# Patient Record
Sex: Male | Born: 1950 | Race: Black or African American | Hispanic: No | Marital: Married | Smoking: Never smoker
Health system: Southern US, Community
[De-identification: ages and names within clinical notes are randomized; demographics above are authoritative.]

## PROBLEM LIST (undated history)

## (undated) DIAGNOSIS — K5792 Diverticulitis of intestine, part unspecified, without perforation or abscess without bleeding: Secondary | ICD-10-CM

## (undated) DIAGNOSIS — Z9889 Other specified postprocedural states: Secondary | ICD-10-CM

## (undated) DIAGNOSIS — K56609 Unspecified intestinal obstruction, unspecified as to partial versus complete obstruction: Secondary | ICD-10-CM

## (undated) DIAGNOSIS — I82409 Acute embolism and thrombosis of unspecified deep veins of unspecified lower extremity: Secondary | ICD-10-CM

## (undated) HISTORY — PX: BRAIN SURGERY: SHX531

## (undated) HISTORY — PX: GASTRECTOMY: SHX58

---

## 2003-12-20 ENCOUNTER — Ambulatory Visit (HOSPITAL_COMMUNITY): Admission: RE | Admit: 2003-12-20 | Discharge: 2003-12-20 | Payer: Self-pay | Admitting: *Deleted

## 2006-02-17 ENCOUNTER — Inpatient Hospital Stay (HOSPITAL_COMMUNITY): Admission: EM | Admit: 2006-02-17 | Discharge: 2006-02-21 | Payer: Self-pay | Admitting: Emergency Medicine

## 2006-02-24 ENCOUNTER — Ambulatory Visit: Payer: Self-pay | Admitting: Pulmonary Disease

## 2006-02-24 ENCOUNTER — Inpatient Hospital Stay (HOSPITAL_COMMUNITY): Admission: EM | Admit: 2006-02-24 | Discharge: 2006-03-12 | Payer: Self-pay | Admitting: Emergency Medicine

## 2006-02-25 ENCOUNTER — Encounter (INDEPENDENT_AMBULATORY_CARE_PROVIDER_SITE_OTHER): Payer: Self-pay | Admitting: *Deleted

## 2006-03-16 ENCOUNTER — Inpatient Hospital Stay (HOSPITAL_COMMUNITY): Admission: AD | Admit: 2006-03-16 | Discharge: 2006-03-25 | Payer: Self-pay | Admitting: General Surgery

## 2006-03-16 ENCOUNTER — Ambulatory Visit (HOSPITAL_COMMUNITY): Admission: RE | Admit: 2006-03-16 | Discharge: 2006-03-16 | Payer: Self-pay | Admitting: General Surgery

## 2006-03-16 ENCOUNTER — Ambulatory Visit: Payer: Self-pay | Admitting: Vascular Surgery

## 2006-08-23 ENCOUNTER — Encounter: Admission: RE | Admit: 2006-08-23 | Discharge: 2006-08-23 | Payer: Self-pay | Admitting: General Surgery

## 2006-09-14 ENCOUNTER — Ambulatory Visit (HOSPITAL_COMMUNITY): Admission: RE | Admit: 2006-09-14 | Discharge: 2006-09-16 | Payer: Self-pay | Admitting: General Surgery

## 2006-09-16 ENCOUNTER — Inpatient Hospital Stay (HOSPITAL_COMMUNITY): Admission: RE | Admit: 2006-09-16 | Discharge: 2006-09-23 | Payer: Self-pay | Admitting: General Surgery

## 2006-09-16 ENCOUNTER — Encounter (INDEPENDENT_AMBULATORY_CARE_PROVIDER_SITE_OTHER): Payer: Self-pay | Admitting: General Surgery

## 2006-12-31 ENCOUNTER — Encounter (INDEPENDENT_AMBULATORY_CARE_PROVIDER_SITE_OTHER): Payer: Self-pay | Admitting: Family Medicine

## 2006-12-31 ENCOUNTER — Ambulatory Visit: Payer: Self-pay | Admitting: Vascular Surgery

## 2006-12-31 ENCOUNTER — Ambulatory Visit (HOSPITAL_COMMUNITY): Admission: RE | Admit: 2006-12-31 | Discharge: 2006-12-31 | Payer: Self-pay | Admitting: Family Medicine

## 2010-05-20 NOTE — Op Note (Signed)
Ross [name], Ross [name] NO.:  [id number]   MEDICAL RECORD NO.:  [id number]          PATIENT TYPE:  INP   LOCATION:  1620                         FACILITY:  [facility]   PHYSICIAN:  Anselm Pancoast. Weatherly, M.D.DATE OF BIRTH:  [birth year]   DATE OF PROCEDURE:  09/16/2006  DATE OF DISCHARGE:                               OPERATIVE REPORT   PREOPERATIVE DIAGNOSES:  1. Status post perforated sigmoid diverticulitis with now [facility] and      end colostomy.  2. History of thrombophlebitis right lower extremity.   OPERATION:  Takedown of Hartmann and establish colon continuity with low  pelvic anastomosis.   ANESTHESIA:  General.   SURGEON:  Anselm Pancoast. Zachery Dakins, M.D.   ASSISTANT:  Amber L. Freida Busman, M.D.   HISTORY:  Ross [name] is a 60 year old male who, in February of 2008,  presented to the emergency room here after having a diverticulitis,  pain, cramping and bloating.  He had been seen in the clinic at [facility] and was admitted by the [facility] hospitalist on the 20th.  He had  been in the hospital on February 14 and a CT showed pericolonic  stranding along the mesenteric vessels like diverticulitis, but his  white count improved and he was discharged on oral antibiotics.  For  about 3-4 days, he had worsening of lower abdominal pain and then  returned to the emergency room and was readmitted, and I was asked to  see him at that time.  The patient had marked wall thickening in the  colon.  There was some question of whether this could possibly be a  small bowel problem, as well as what was thought to be diverticulitis,  and with his condition worsening in spite of broad antibiotic coverage,  he was taken to surgery on the 21st and had marked inflammation of the  sigmoid colon with multiple interloop abscesses, and I removed the  inflamed intestines, did an end colostomy and a Hartmann, but he was so  edematous it was impossible to actually close his fascia.  We  packed him  open with wound vacs, and then approximately 3 days later after being on  a respirator in the ICU, took him back.  The inflammation was markedly  improved and I was able to close him at that time.  He started having  bowel function approximately 2-3 days later and was ready for discharge  on March 7, approximately 2 weeks after his surgery, and then started  having leg swelling.  I saw him back in the office approximately 2 days  later.  He was on Lovenox and a prophylactic regimen during this  hospitalization, but the patient weighed over 300 pounds.  Anyway, he  came in with swollen right lower extremity and was readmitted, started  on full heparin, and ultrasound Doppler studies showed that he had  thrombophlebitis of the right lower extremity.  Since then, he has  definitely improved.  He has been managed with the anticoagulation by  Dr. Theresia Lo at [facility], and is now approximately 4-5 months following his  hospitalization, and is admitted now  for colostomy takedown.  The  patient was tentatively scheduled for surgery on Tuesday.  He has been  off his Coumadin now since, I think, last Thursday, and we will start  him on Lovenox on Saturday, but he was confused about when he should  discontinue the Lovenox, and had actually taken a dose of 120 mcg  b.i.d..  His weight is still about 265 pounds.  He had been over 300  pounds.  With him receiving that on the a.m. on Tuesday, I cancelled him  since he was most likely going to get into a bleeding coagulation.  We  did not reprep him, but continued him on a liquid diet and he got his  Lovenox last night, none this morning, and was on the OR schedule for  12:30 today.  Preoperatively, he was taken to the operative suite after  he had received 3 grams of Unasyn and the anesthesiologist was able to  intubate him without difficulty and he has PAS stockings.  I did not  give him any prophylactic dose of Lovenox in preparation for the  surgery  but we hope to start the Lovenox fairly soon after the operation.  We do  have PAS stockings on both lower extremities.   The patient was positioned on the OR table.  Foley catheter inserted  sterilely.  On the abdomen, I first closed the mucosa for end colostomy  and then prepped with Betadine surgical scrub solution and draped in  sterile manner.  The patient's old incision was excised and scar had  hypertrophied where there had been delayed closure.  You could see a  couple of the Novofil sutures and all the sutures were taken down and  the actual skin was removed.  I opened through the midline fascia.  Fortunately, the patient does not have horrible intra-abdominal  adhesions.  He has significant adhesions in the pelvis where he had all  the abscesses and everything, but it is not as bad as if we had tried to  do his surgery earlier, and we could identify the distal sigmoid.  We  had done a barium enema through the proximal and lower.  There appeared  to be two isolated tics in the proximal colon, one kind of close to the  hepatic flexure and the other in the transverse colon, and we are  planning to do an extensive further resection since I had taken a very  generous descending colon and sigmoid colon on his original surgery.  The distal portion really has no significant tics and we opened up the  abdomen very carefully, dissected the omentum from the undersurface of  the abdominal wall and then could get down into the actual pelvis.  He  does have adhesions and these were kind of loosely taken down so that we  did not run the bowel completely from ligament of Treitz and the  terminal ileum, but there are no tight kinks, etc., and probably 95% of  the adhesions were taken down, and the bowel looks unremarkable.  The  distal sigmoid and rectum were freed up.  I elected to remove about 2  inches of the proximal sigmoid so that we were down at an area where  there appears  there is still a lot of fatty tissue around it, but good  wall thickness, and we have a transverse transection of the colon at  this level.  In the proximal area, I had taken down the colostomy  through the  abdominal wall and had gone down basically right at the  peritoneal reflection, not the area that had been up in the colostomy,  but with this, with just kind of mobilizing the proximal peritoneal  lateral reflection and just a little bit of splenic flexure, it will  come together without excessive tension.  I then transected this using  Allen clamp and proximal end of the noncrush clamp and distally removed  probably about 3 inches of distal sigmoid and about 2 inches probably of  the old colostomy proximally, and then a single layer of 3-0 silk knots  inverted anastomosis under direct vision, the anterior sutures, there  were a few Connell sutures anteriorly, and the anastomosis has a good 1-  2 finger size and is lying nicely.  There were a couple of  little areas  of the mesentery going to this most distal sigmoid and it was transected  really right at the bowel wall and these have been tied with 3-0 silk.  I did not identify the left ureter, but we did not open up any  retroperitoneal area either.  On inspection of the area in the left  upper quadrant where we had mobilized close to the splenic factor and in  the pelvis, there was a little bit of oozy toozy from the adhesions  but not actually any active bleeding.  We thoroughly irrigated, washed,  irrigated and reinspected and I elected not to place any drains since  there are no abscesses.  I then closed a little bit of the peritoneum  most inferiorly where there was a little bit of raw surface above the  bladder, and then the fascia was closed with #1 Novofil.  Some were  figure-of-eights.  Some were simple sutures.  In all, a total of about  four packs of sutures were used.  The skin was closed with staples where  it had the  old colostomy.  I closed the inner layer of peritoneum  posterior with 0-Vicryl.  The fascia and the muscle was approximated  with interrupted sutures of #1 Novofil.  I closed the skin after  mobilizing the subcutaneous tissue and used 3-0 Vicryl in the old  colostomy site and then closed with staples in kind of an oblique  direction.  The patient tolerated procedure nicely and has an NG tube in  the stomach.  I am going to recheck lab studies in the morning and then  would like to really start him on Lovenox tomorrow, but I may use kind  of a smaller dose than pure therapeutic because there was certainly more  dissection and I certainly do not want to get any problems with bleeding  postop.  He has got PAS stockings, which we will certainly continue and  do very early ambulation.  We will keep the NG tube for at least 24-48  hours, and hopefully, he will start passing flatus in 2-3 days, and then  start him on a limited liquid diet.           ______________________________  Anselm Pancoast. Zachery Dakins, M.D.     WJW/MEDQ  D:  09/16/2006  T:  09/17/2006  Job:  [id number]   cc:   Vikki Ports, M.D.  Fax: [phone]

## 2010-05-20 NOTE — Discharge Summary (Signed)
NAMETREYON, WYMORE NO.:  [id number]   MEDICAL RECORD NO.:  [id number]          PATIENT TYPE:  INP   LOCATION:  1620                         FACILITY:  [facility]   PHYSICIAN:  Anselm Pancoast. Weatherly, M.D.DATE OF BIRTH:  March 14, 1950   DATE OF ADMISSION:  09/16/2006  DATE OF DISCHARGE:  09/23/2006                               DISCHARGE SUMMARY   DISCHARGE DIAGNOSES:  1. Status post perforated diverticulitis with multiple intra-abdominal      abscesses, Hartmann and end colostomy.  2. History of thrombophlebitis right lower extremity.  3. Exogenous obesity.   POSTOPERATIVE DIAGNOSES:  1. Status post perforated diverticulitis with multiple intra-abdominal      abscesses, Hartmann and end colostomy.  2. History of thrombophlebitis right lower extremity.  3. Exogenous obesity.   OPERATION:  Takedown of end colostomy, Hartmann and reestablish bowel  continuity, low pelvic anastomosis.   HISTORY:  The patient is a 60 year old overweight male who early in the  year presented to [facility] after having episodes of  diverticulitis.  He had been hospitalized, sent home on oral  medications, became worse and was readmitted and had multiple intra-  abdominal abscesses in the abdomen and pelvis.  He underwent an  emergency laparotomy, end colostomy, Hartmann and the bowel so edematous  that we had to do with delayed closure of a wound VAC for approximately  48 hours.  Postoperatively, after he was discharged, developed right  lower extremity thrombophlebitis.  He has been on anticoagulation for  the last six months.  Dr. Theresia Lo is actually managing that and he has  had a barium enema that showed a couple of diverticulosis in the  transverse and one kind of in the splenic flexure area but no evidence  of any significant active disease, etc.  We had admitted him now for  takedown of his colostomy and reestablish bowel continuity.  We had  converted him over from  Coumadin to Lovenox and he made a mistake and  took a dose of Lovenox 120 mcg on the morning we had scheduled surgery.  We postponed him and brought him back in two days later and then  proceeded with his surgery.  Postoperatively, he did nicely.  He had an  NG tube for about 24 hours.  His Foley catheter came out at 48 hours.  It was about three days after surgery before he started passing any  flatus.  He had pretty extensive adhesions in the pelvis and then we  started him on a full liquid diet and he has had a regular diet now for  approximately 24 hours.  He has been on Lovenox 100 mcg twice a day,  starting 24 hours after surgery.  His incisions appear to be healing  nicely.  We have restarted the Coumadin and his Coumadin yesterday had  an INR of 2.1.  Today it has dropped down to 1.7.  He normally takes 14  alternating with 12 mg of Coumadin daily and he has been on 10 for the  past five days.  Since he has dropped back down, we will  resume his  regular dosage and he will continue giving himself the Lovenox, but one  shot a day on Friday, Saturday and a repeat prothrombin time on Monday  morning.  Hopefully, he can discontinue the Lovenox at that time and  will see me in the office for wound check later in the  week.  Half the  staples have been removed.  There is a little bit of drainage from a little area of the skin since  he is anticoagulated, but the wound is healing without evidence of  infection.  The path report showed chronic inflammation, fibrosis and  reactive lymphoid aggregates around the little segment of colostomy and  the distal sigmoid but no evidence of any active diverticulitis.           ______________________________  Anselm Pancoast. Zachery Dakins, M.D.     WJW/MEDQ  D:  09/23/2006  T:  09/23/2006  Job:  [id number]   cc:   Vikki Ports, M.D.  Fax: [phone]   Patient's chart

## 2010-05-21 ENCOUNTER — Emergency Department (HOSPITAL_COMMUNITY): Payer: No Typology Code available for payment source

## 2010-05-21 ENCOUNTER — Emergency Department (HOSPITAL_COMMUNITY)
Admission: EM | Admit: 2010-05-21 | Discharge: 2010-05-21 | Disposition: A | Discharge status: Home / Self Care | Payer: No Typology Code available for payment source | Attending: Emergency Medicine | Admitting: Emergency Medicine

## 2010-05-21 DIAGNOSIS — M2390 Unspecified internal derangement of unspecified knee: Secondary | ICD-10-CM | POA: Insufficient documentation

## 2010-05-21 DIAGNOSIS — Z189 Retained foreign body fragments, unspecified material: Secondary | ICD-10-CM | POA: Insufficient documentation

## 2010-05-21 DIAGNOSIS — Y92410 Unspecified street and highway as the place of occurrence of the external cause: Secondary | ICD-10-CM | POA: Insufficient documentation

## 2010-05-21 DIAGNOSIS — S92909A Unspecified fracture of unspecified foot, initial encounter for closed fracture: Secondary | ICD-10-CM | POA: Insufficient documentation

## 2010-05-21 DIAGNOSIS — R10811 Right upper quadrant abdominal tenderness: Secondary | ICD-10-CM | POA: Insufficient documentation

## 2010-05-21 DIAGNOSIS — S5000XA Contusion of unspecified elbow, initial encounter: Secondary | ICD-10-CM | POA: Insufficient documentation

## 2010-05-21 DIAGNOSIS — S2249XA Multiple fractures of ribs, unspecified side, initial encounter for closed fracture: Secondary | ICD-10-CM | POA: Insufficient documentation

## 2010-05-21 DIAGNOSIS — S0990XA Unspecified injury of head, initial encounter: Secondary | ICD-10-CM | POA: Insufficient documentation

## 2010-05-21 DIAGNOSIS — M795 Residual foreign body in soft tissue: Secondary | ICD-10-CM | POA: Insufficient documentation

## 2010-05-21 DIAGNOSIS — I498 Other specified cardiac arrhythmias: Secondary | ICD-10-CM | POA: Insufficient documentation

## 2010-05-21 DIAGNOSIS — R079 Chest pain, unspecified: Secondary | ICD-10-CM | POA: Insufficient documentation

## 2010-05-21 DIAGNOSIS — K5730 Diverticulosis of large intestine without perforation or abscess without bleeding: Secondary | ICD-10-CM | POA: Insufficient documentation

## 2010-05-21 DIAGNOSIS — IMO0002 Reserved for concepts with insufficient information to code with codable children: Secondary | ICD-10-CM | POA: Insufficient documentation

## 2010-05-21 LAB — CBC
Platelets: 152 10*3/uL (ref 150–400)
RBC: 4.38 MIL/uL (ref 4.22–5.81)
WBC: 6.4 10*3/uL (ref 4.0–10.5)

## 2010-05-21 LAB — COMPREHENSIVE METABOLIC PANEL
AST: 38 U/L — ABNORMAL HIGH (ref 0–37)
Albumin: 3.9 g/dL (ref 3.5–5.2)
Alkaline Phosphatase: 69 U/L (ref 39–117)
Chloride: 104 mEq/L (ref 96–112)
Creatinine, Ser: 0.94 mg/dL (ref 0.4–1.5)
GFR calc Af Amer: >60 mL/min (ref >60)
Potassium: 3.5 mEq/L (ref 3.5–5.1)
Total Bilirubin: 1.2 mg/dL (ref 0.3–1.2)

## 2010-05-21 LAB — DIFFERENTIAL
Basophils Absolute: 0 10*3/uL (ref 0.0–0.1)
Basophils Relative: 0 % (ref 0–1)
Eosinophils Absolute: 0.1 10*3/uL (ref 0.0–0.7)
Neutrophils Relative %: 62 % (ref 43–77)

## 2010-05-21 MED ORDER — IOHEXOL 300 MG/ML  SOLN
100.0000 mL | Freq: Once | INTRAMUSCULAR | Status: AC | PRN
  Administered 2010-05-21: 100 mL via INTRAVENOUS

## 2010-05-23 NOTE — H&P (Signed)
NAMEJOVE, BEYL NO.:  [id number]   MEDICAL RECORD NO.:  [id number]          PATIENT TYPE:  INP   LOCATION:  1430                         FACILITY:  [facility]   PHYSICIAN:  Anselm Pancoast. Weatherly, M.D.DATE OF BIRTH:  May 28, 1950   DATE OF ADMISSION:  03/16/2006  DATE OF DISCHARGE:                              HISTORY & PHYSICAL   READMISSION NOTE:   CHIEF COMPLAINT:  Leg pain and swelling, right.   HISTORY:  Ross [name] is a 60 year old overweight black male who was  admitted here a little over two weeks ago with persistent  diverticulitis, developing an abscess in the pelvis.  Had an emergency  surgery.  I think it was on Thursday.  Was found to have markedly intra-  abdominal abscesses, perforated diverticulitis, so edematous that we did  a wound vac and then was taken back to surgery approximately four days  later.  The wound closed.  There was no evidence of any frank abscesses.  The cultures were negative at that time.  He also had gallstones that  were removed during the first operation.  The patient improved, was on  subcutaneous heparin 5000 units subcu q.8h. initially, and then after he  was having NG bleeding from the NG tube, we switched him to q.12h.,  which was continued right up to the time that he was discharged last  Friday.  He has a delayed closure of his abdominal wound, and I saw him  back in the office today for a wound check.  He appears to be doing fine  from an abdominal standpoint and denied any definite shortness of  breath, but did have swelling in the right lower extremity to 2+, that  was definitely asymmetrical with the other side.  I sent him over to  [facility] for a duplex Doppler study to rule out thrombophlebitis.  The  pO2 in the office was 95.  The patient did not appear to be  significantly short of breath.  He is a large individual and always  seems to have a little bit more energy with his breathing.  When he was  admitted up to the floor, his pO2 on room air was 98%.  The patient on  questioning whether he has had any chest discomfort, etc., said, No,  not really, that once when he was going up steps at home, he maybe  became a little lightheaded but no definite chest pain.  He is not  complaining of chest pain at this time.   PAST MEDICAL HISTORY:  The patient states that besides being overweight,  he has never had any actual medical problems with problems with sugar,  blood pressure, cardiac issues.  He was discharged only on Vicodin.  Please refer to the past medical history or the review of systems for  details.  The patient is a Optician, dispensing.  Is now readmitted to 1430.   PHYSICAL EXAMINATION:  VITAL SIGNS:  His pulse was 100.  His  respirations were 24.  Blood pressure is 130/87.  His temperature is  98.8.  in the office, he weighed 240.6  pounds.  He says his weight is  normally about 245 pounds.  He is not very tall.  HEENT:  Unremarkable.  He appears adequately hydrated.  LUNGS:  Good breath sounds bilaterally in the office.  CARDIAC:  Slight sinus tachycardia of 100.  ABDOMEN:  His incision appears to be healing satisfactorily.  He has  some delayed sutures from the closure of his lower abdominal wound.  His  colostomy is pink, functioning nicely.  EXTREMITIES:  He definitely has a swollen right lower leg, compared with  the left, with 2+ edema on the right, and essentially no edema on the  left.  Duplex Doppler study is consistent with clots on both lower  extremities, right more so than the left.   The patient is now readmitted for intravenous heparin.  I am going to  keep him at bedrest until the swelling subsides.  We will go ahead and  give him a bolus of heparin and continuous heparin infusion at this  time.  Will allow him to be at bedside to void.  Of course, he has a  colostomy, so it is not necessary to go to the bathroom in order to have  bowel function.  The patient will  continue with the Vicodin for pain.  He says his pain is in the right lower leg, it just feels tight.  He is  not complaining of any abdominal pain.  He has required no pain  medication for his abdomen since Sunday.  He was discharged last Friday.           ______________________________  Anselm Pancoast. Zachery Dakins, M.D.     WJW/MEDQ  D:  03/16/2006  T:  03/18/2006  Job:  [id number]

## 2010-05-23 NOTE — Op Note (Signed)
NAMEMASE, DHONDT NO.:  [id number]   MEDICAL RECORD NO.:  [id number]          PATIENT TYPE:  INP   LOCATION:  0159                         FACILITY:  [facility]   PHYSICIAN:  Anselm Pancoast. Weatherly, M.D.DATE OF BIRTH:  [birth year]   DATE OF PROCEDURE:  03/01/2006  DATE OF DISCHARGE:                               OPERATIVE REPORT   PREOPERATIVE DIAGNOSIS:  Status post sigmoid colectomy with drainage of  pelvic and multiple intra-abdominal abscesses, end colostomy and  Hartmann and cholecystectomy.  The patient had a wound VAC.   POSTOPERATIVE DIAGNOSIS:  Significant improvement of no evidence of any  intra-abdominal abscesses.  The operation was kind of a mini laparotomy,  irrigation, cultures, and closure of abdominal wound.   ANESTHESIA:  General.   SURGEON:  Anselm Pancoast. Zachery Dakins, M.D.   HISTORY:  Ross [name] is a 60 year old overweight male who was  operated on in the early a.m. on last Thursday with about a five day  history of progressive abdominal pain, fever.  Had been hospitalized for  about three days.  Discharged but bounced back in approximately 24  hours.  CT showed definite collections, and it looked like possibly  whether it was a diverticulitis or ischemic segment of small bowel, we  could not be sure radiologically, but at the time of surgery, he was  found to have significant sigmoid diverticulitis, multiple pelvic and  interloop abscesses.  Also, gallstones.  A sigmoid colectomy, Hartmann  and end colostomy was performed.  Open cholecystectomy.  In a sense, he  was so edematous, so tight and distended, we closed him with a wound VAC  and have kept him paralyzed now for approximately four days.  He did  when he came back to the ICU approximately six hours postoperatively, he  became light and eviscerated, but I paralyzed him and reclosed him with  the wound VAC at bedside.  He has done nicely since then.  He is no  longer acidotic.  His white  count is coming down.  He has broad  antibiotic coverage.  The cultures have shown multiple organisms, and  the original path report showed a perforated colon as an etiology of  these multiple intra-abdominal and pelvic abscesses.  We have discussed  with the family that hopefully we will be able to just kind of explore  him, make sure there is no evidence of any new abscesses and go ahead  and close his wound.  If his bowel is so edematous as it was before,  they understand that we may need to apply another wound VAC and wait a  few days before closure of the abdomen.   The patient was taken to over to the operating suite, intubated,  paralyzed, positioned on the OR table, and the anesthetic agent switched  over to the anesthesiologist.   I first changed his colostomy bag, as it was over part of the wound VAC,  with Steri-Strips, draped, and put a new wound in through his quarter-  inch Hollister bag over the stomach.  The mucosa is nice and pink with a  small amount of stool in it.  I then removed the outer wound VAC,  plastic, etc., and I flushed a few stitches in it at the time of  surgery. When I closed it at the bedside where he eviscerated, and these  were removed.  We then removed the sponge and the one that is in the  plastic, and I was very impressed with how much greatly improved he is  now.  In the pelvis where he has had such a large abscess, hemorrhagic,  everything initially now, you can see the drain.  There is no evidence  of any obvious pelvic abscesses.  The mucosa where I transected the  upper rectum, I did place a couple of 2-0 Prolene sutures in it to  identify this when we do a colostomy take-down in 4-5 months, and the  area where the staple closure is definitely intact.  Then the interloop  abscesses, 3-4, with such an edematous segment of terminal ileum and  all, that really looks nearly normal now, greatly improved, and the  bowel is found to have abnormal  caliber, and he is not as distended as  he was when we operated on him.  Next, I looked up under the subhepatic  area where the open cholecystectomy is, and there is no bile, there is  no evidence of infection, etc.  I ran my hand up around the spleen.  There was a little bit of kind of clear fluid but certainly no evidence  of any frank pus in any of these areas.  I then very gently kind of  separated the omentum from the loops of small bowel in the mid section  to make sure there were no other interloop abscesses and found nothing,  abscess or questionable small bowel.  I then put the omentum back over  the small bowel by advancing it down a little bit, so we could get  complete closure over the bowel with a midline incision and then closed  this incision with single interrupted 1-0 Novofil.  I did put a couple  of retention sutures of #5 Ethibond with ridges and placed a few 2-0  silk sutures that did not tie in the skin so that if he continues having  good granulation tissue and healing, I can approximate the adipose area  in the lower abdomen without actually suturing the area to minimize the  pain, as we will do a delayed closure in probably 5-6 days.  I am very  pleased with our findings.  The sponge count was correct.  There was an  incorrect instrument __________ .  An Freida Busman was mixed in with a Market researcher.  We got an x-ray.  There should not be any since very few instruments  were used since the intra-abdominal was mainly __________  laparotomy  only and the resection was sutured instead of with the exception of  putting the little sutures in the rectal stump for later identification.  The patient will be taken back to the ICU intubated but will now start  him on kind of a taper down of the anesthetic paralytic agents, so  hopefully he can be extubated in the morning, provided he continues to  do well.  His chest x-ray today was certainly improved, and he has been diuresed to get the  significant edema that was so prevalent immediately  after surgery.  He is on TPN, and we are going to continue with the  broad antibiotic coverage and wait  for results of the overall anaerobic  and aerobic cultures that we did tonight.           ______________________________  Anselm Pancoast. Zachery Dakins, M.D.     WJW/MEDQ  D:  03/01/2006  T:  03/02/2006  Job:  [id number]

## 2010-05-23 NOTE — Discharge Summary (Signed)
Ross [name], Ross [name] NO.:  [id number]   MEDICAL RECORD NO.:  [id number]          PATIENT TYPE:  INP   LOCATION:  1620                         FACILITY:  [facility]   PHYSICIAN:  Kela Millin, M.D.DATE OF BIRTH:  09/10/1950   DATE OF ADMISSION:  02/17/2006  DATE OF DISCHARGE:  02/21/2006                               DISCHARGE SUMMARY   DISCHARGE DIAGNOSES:  1. Diverticulitis.  2. Hypokalemia.  3. History of diverticulosis.   HISTORY OF PRESENT ILLNESS:  The patient is a 60 year old, black male  with a past medical history significant for diverticulosis - per a  colonoscopy a few years ago who presented with complaints of abdominal  pain.  He reported that he had begun having left lower quadrant  pain 5  to 6 days prior to admission.  He admitted to nausea but no vomiting.  He also stated that he had been constipated for some time and went to an  urgent care clinic where he was given an enema and had some liquid  stools thereafter.  He denied chest pain, shortness of breath, dysuria,  melena, also denied hematochezia.  He was seen in the ER and the CT scan  of the abdomen showed findings consistent with a diverticulitis and he  was admitted for further evaluation and management.   PHYSICAL EXAMINATION:  Upon admission revealed a temperature of 98.4 and  a blood pressure 117/71.  Pulse of 88.  Respiratory rate of 24.  O2 sat  was 93%.  The pertinent findings on exam were:  HEENT:  Dry mucous membranes.  ABDOMEN:  Obese, bowel sounds present. Left lower quadrant tenderness  with no rebound tenderness, nondistended.  No organomegaly.  No masses  palpable.  The rest of his physical exam was within normal limits.   LABORATORY DATA:  The CT scan of his abdomen and pelvis revealed left  lower quadrant pericolonic stranding extending along the mesenteric  vessels to SMV/PV, mostly diverticulitis without abscess or perforation.  His white cell count was 10.2,  hemoglobin 13.9, hematocrit of 39.5 with  a platelet count of 95 and neutrophil of 83%.  His sodium was 133,  potassium 3.3, chloride 100, CO2 26, glucose 135, BUN 19, creatinine  1.1, calcium 8.5.  He was lipase was 14, amylase 27.  Urinalysis was  trace leukocyte esterase, nitrite negative, and 3-6 WBCs.   HOSPITAL COURSE:  1. Diverticulitis - upon admission, the patient had a CT scan of his      abdomen and pelvis and the results as stated above.  He was kept      NPO, hydrated with IV fluids, and placed on IV analgesic pain      control.  He had plain films of his abdomen done on the next      hospital day and these were negative for obstruction or free air.      As his symptoms improved, he was started on a clear-liquid diet and      this was gradually advanced to a low-residual diet.  The patient's      nausea resolved,  and he has been tolerating a low-residual diet      well.  He had a bowel movement with no hematochezia and no melena.      The patient was started on antibiotics upon admission and he will      be discharged on oral antibiotics and is to follow up with his      primary care physician.  The patient will also need follow up with      a gastroenterologist for further evaluation with colonoscopy once      he is completely recovered from this episode of diverticulitis and      I have discussed this with him and he will follow up with his      primary care physician for the referral.  2. Hypokalemia - The patient's potassium was replaced during his      hospital stay.   DISCHARGE MEDICATIONS:  1. Cipro 500 mg 1 p.o. b.i.d.  2. Flagyl 500 mg p.o. q. 8 hours.  3. Percocet 1 to 2 p.o. q. 4 to 6 hours p.r.n.   DISCHARGE CONDITION:  Improved.   FOLLOWUP:  Dr. Theresia Lo in 1-2 weeks.      Kela Millin, M.D.  Electronically Signed     ACV/MEDQ  D:  02/21/2006  T:  02/21/2006  Job:  [id number]   cc:   Vikki Ports, M.D.  Fax: [phone]

## 2010-05-23 NOTE — H&P (Signed)
NAMEHIROSHI, KRUMMEL NO.:  [id number]   MEDICAL RECORD NO.:  [id number]          PATIENT TYPE:  INP   LOCATION:  1620                         FACILITY:  [facility]   PHYSICIAN:  Kela Millin, M.D.DATE OF BIRTH:  25-Apr-1950   DATE OF ADMISSION:  02/17/2006  DATE OF DISCHARGE:                              HISTORY & PHYSICAL   PRIMARY CARE PHYSICIAN:  Dr. Theresia Lo.   CHIEF COMPLAINT:  Abdominal pain.   HISTORY OF PRESENT ILLNESS:  The patient is a 60 year old black male  with no significant past medical history who presents with a complaint  of abdominal pain times 5 to 6 days.  He describes the pain as severe, 9  out of 10 in intensity, and left lower quadrant in location.  No  precipitating or alleviating factors reported.  He admits to chills,  nausea, but no vomiting.  He denies diarrhea, states that his last bowel  movement was Friday night.  After he ate some chicken, he had a loose  bowel movement at that time, but stated that he has not had any bowel  movements since then - about 5 days ago.  He states that he went to an  urgent care clinic on Monday and was given an enema and initially states  that he did not have any bowel movement after the enema, but later on  states that he had some liquid stools after the enema.  He also states  that prior to developing the abdominal pain, he had regular daily bowel  movements.  He denies chest pain, shortness of breath, dysuria, melena,  or hematochezia.  He admits to a nonproductive cough times a few days.   The patient was seen in the ER and a CT scan of his abdomen revealed  findings consistent with diverticulitis and he is admitted to the [facility] service for further evaluation and management.   PAST MEDICAL HISTORY:  None.   MEDICATIONS:  None.   ALLERGIES:  NKDA.   SOCIAL HISTORY:  He states that he quit tobacco in 1978 after smoking  for about 5 years.  He denies alcohol.   FAMILY HISTORY:   He has 2 sisters with diabetes mellitus and 1 brother  deceased at the age of 57 following an MI.   REVIEW OF SYSTEMS:  As per HPI, other review of systems is negative.   PHYSICAL EXAMINATION:  GENERAL:  The patient is an obese middle-aged  black male.  He is alert and oriented, and appears to be in mild to  moderate distress secondary to his abdominal pain.  VITAL SIGNS:  Temperature 98.4, blood pressure 115/71 but initially  129/85, pulse of 88, and respiratory rate of 24.  O2 sat of 93%.  HEENT:  PERRL, EOMI.  Dry mucous membranes.  No oral exudates.  NECK:  Supple.  No adenopathy.  No thyromegaly.  No JVD.  LUNGS:  Clear to auscultation bilaterally.  No crackles or wheezes.  CARDIOVASCULAR:  Regular rate and rhythm.  Normal S1, S2.  No murmurs or  gallops appreciated.  ABDOMEN:  Obese, bowel sounds  present.  Left lower quadrant tenderness.  No rebound tenderness.  Nondistended.  No organomegaly appreciated.  No  masses palpable.  EXTREMITIES:  No cyanosis.  No edema.  NEURO:  Alert, oriented x3.  Cranial nerves, II through XII, are grossly  intact.  Nonfocal exam.   LABORATORY DATA:  CT of abdomen and pelvis - left lower quadrant  pericolonic stranding extending along mesenteric vessels to SMV/PV  confluence, most likely diverticulitis without abscess or perforation.  His white cell count is 15.2, hemoglobin 13.9, hematocrit 39.5, platelet  count 95, neutrophil count 83%.  His sodium is 134 with a potassium of  3.3, chloride 100, CO2 of 26, glucose 131, BUN 19, creatinine 1.1,  calcium 8.5.  Albumin 2.9.  His AST is 43.  Lipase 14, amylase 27.  Urinalysis with specific gravity 1.026, clear in appearance, trace  leukocyte esterase, negative nitrite, 3-6 WBCs.   ASSESSMENT AND PLAN:  1. Diverticulitis - as discussed above, we will place the patient on      antibiotics, intravenous fluids, and pain management/supportive      care.  Keep n.p.o.  As noted per the CT scan, no  evidence of      abscess or perforation.  We will follow and consider surgical      consultation when appropriate.  The patient will need outpatient      follow up with gastroenterology when he recovers for      screening/further evaluation as appropriate.  2. Hypokalemia - replete potassium.  3. Probable urinary tract infection - obtain urine cultures, empiric      antibiotics as above.      Kela Millin, M.D.  Electronically Signed     ACV/MEDQ  D:  02/18/2006  T:  02/18/2006  Job:  [id number]   cc:   Vikki Ports, M.D.

## 2010-05-23 NOTE — Discharge Summary (Signed)
NAMEDENNARD, VEZINA NO.:  [id number]   MEDICAL RECORD NO.:  [id number]          PATIENT TYPE:  INP   LOCATION:  1302                         FACILITY:  [facility]   PHYSICIAN:  Ross Shad. Rudean Curt, MD     DATE OF BIRTH:  1950/02/10   DATE OF ADMISSION:  02/23/2006  DATE OF DISCHARGE:  03/12/2006                               DISCHARGE SUMMARY   DISCHARGE DIAGNOSES:  1. Perforated sigmoid diverticulitis with multiple pelvic and intra-      abdominal abscesses.  2. Ventilator-dependent respiratory failure.   CONSULTATIONS:  Dr. Consuello [name] of [facility].   PROCEDURE:  1. Exploratory laparotomy with lysis of adhesions, drainage of      abscesses, sigmoid colectomy with end-colostomy, Hartmann      procedure, and cholecystectomy performed by Dr. Zachery [name] on      February 21.  2. Mini laparotomy with irrigation and closure of abdominal wound,      performed by Dr. Zachery [name] on February 25th.   SUMMARY OF HOSPITALIZATION:  Ross [name] is a 60 year old male with no  past medical history, who was admitted with abdominal pain on February  20th.  Details of his presentation can be found in his history and  physical dictated by Dr. Rito [name] on February 20th.  The patient was  found to have intra-abdominal abscesses and was taken to the operating  room on February 21, where an exploratory laparotomy was performed,  revealing perforated sigmoid diverticulitis with multiple intra-  abdominal abscesses.  These abscesses were drained, adhesions were  lysed, and a sigmoid colectomy with end-colostomy was performed.  The  patient was sent from the operating room directly to the intensive care  unit on full ventilator support.  His early ICU course was complicated  by the opening of his abdominal wound with evisceration at the bedside.  He was managed very closely by Dr. Shelle [name] and the critical care team in  concert with Dr. Zachery [name] from surgery.  He was kept on  the ventilator  and paralyzed while receiving broad-spectrum intravenous antibiotics.   On February 25th, the patient was taken back to the operating room,  where it was found that his abdominal cavity had greatly improved, and  there was no evidence of further intra-abdominal abscesses.  His  abdominal wound was closed at that time.  He was extubated on February  27th and returned to my service on March 1.  Surgery remained  actively involved with patient and managed most of his active issues.  He remained on total peripheral nutrition until he was able to advance  his diet during the last few days before discharge.  The patient will be  managed closely by Dr. Zachery [name] as an outpatient.  We will give final  recommendations prior to discharge.      Ross Shad. Rudean Curt, MD  Electronically Signed     PML/MEDQ  D:  03/11/2006  T:  03/11/2006  Job:  [id number]   cc:   Ross [name], M.D.  [address], [address]  [place]  [place] [address]   Ross Fus  Rodell [name], M.D.  Fax: [phone]

## 2010-05-23 NOTE — H&P (Signed)
NAMEGILMAN, OLAZABAL NO.:  [id number]   MEDICAL RECORD NO.:  [id number]          PATIENT TYPE:  EMS   LOCATION:  ED                           FACILITY:  [facility]   PHYSICIAN:  Hollice Espy, M.D.DATE OF BIRTH:  [birth year]   DATE OF ADMISSION:  02/24/2006  DATE OF DISCHARGE:                              HISTORY & PHYSICAL   PRIMARY CARE PHYSICIAN:  Dr. Lanell Persons of [employer].   CONSULTANT:  Dr. Zachery Dakins, Surgery.   CHIEF COMPLAINT:  Abdominal pain.   HISTORY OF PRESENT ILLNESS:  The patient is a 60 year old African  American male with no past medical history, who was recently on the  [facility] Service for abdominal pain.  Pain started on February 12, 2006, mostly in the left lower quadrant.  He was admitted on February 18, 2006 as per his CT scan showing pericolonic stranding along the  mesenteric vessels, likely diverticulitis.  His white count at the time  was 15.2.  The patient was kept on the hospitalist service for 3-4 days,  but changed over to p.o. antibiotics and discharged home.  Since then,  his abdominal pain has worsened and he has returned back to the hospital  with a white count again around 15 and slight dehydration.  A CT scan  was reordered and he was found to have no acute findings in the abdomen,  but the pelvis showed worsening inflammatory process in the left lower  quadrant, not intimately associated with the sigmoid colon.  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  The patient had an elevated creatinine at  the time of admission and therefore his study was done without contrast.  Wall thickening within the left lower abdominal/upper pelvis small bowel  loop compatible with enteritis was noted, new since the previous study.  After discussion with Surgery, the patient was evaluated  by Dr.  Zachery Dakins, who felt that the patient would need to be readmitted and  probably need a resection and possible anastomoses.  In the meantime,  they recommended broad-spectrum antibiotics and bowel rest.  Currently,  the patient is feeling well.  He has received pain medication and he is  feeling better.  He denies any headaches or visual changes.  No chest  pain or palpitations.  No shortness of breath.  No wheezing or cough.  He does complain of some generalized abdominal pain; however, it is  significantly much better than previous.  He denies any hematuria or  dysuria.  No diarrhea.  He has had a little bit of constipation.  He  denies any focal extremity numbness, weakness or pain and overall he  feels quite fatigued.  His review of systems is otherwise negative.   PAST MEDICAL HISTORY:  Essentially unremarkable, other than his recent  hospitalization for his abdominal pain a few days earlier.   MEDICATIONS:  Normally are none.  He has been put on recent Cipro,  Flagyl and Percocet, on which he was sent  home.   ALLERGIES:  None.   SOCIAL HISTORY:  He denies any tobacco, alcohol or drug use.  He used to  be a smoker, but quite many years ago.   FAMILY HISTORY:  Noncontributory.   PHYSICAL EXAMINATION:  VITALS ON ADMISSION:  Temperature 98.1, heart  rate 81, blood pressure initially 90/51 and following IV fluids, 121/82,  respirations 20.  O2 SAT 95% on room air.  GENERAL:  The patient is alert and oriented x3, in some mild distress  secondary to abdominal pain.  HEENT:  Normocephalic, atraumatic.  His mucous membranes are dry.  NECK:  He has no carotid bruits.  HEART:  Regular rate and rhythm.  S1 and S2.  LUNGS:  He has some mild bilateral expiratory wheezing.  ABDOMEN:  Soft, but distended.  Some generalized tenderness mostly over  the umbilicus.  Decreased breath sounds.  EXTREMITIES:  No clubbing or cyanosis.  Trace pitting edema.   LABORATORY WORK:  White count  15.2, H&H 14.2 and 40.8, MCV of 91,  platelet count 423,000 and 90% neutrophils.  Sodium 128, potassium 4.7,  chloride 101, bicarb 20, BUN 18, creatinine 1.9, glucose 135.  Albumin  is noted at 2.8; the rest of his LFTs are unremarkable.  Lipase is  normal at 83.  UA shows a small amount of leukocyte esterase, 100 of  protein, 1 of urobilinogen.  Cardiac enzymes are unremarkable and he  only has 0-2 white and red cells with calcium oxalate crystals present.   ASSESSMENT AND PLAN:  1. 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  As      per Surgery, we will put the patient on bowel rest, pain control,      intravenous fluids and broad-spectrum antibiotics.  We will follow      him with serial exams and he may need to go to surgery.  In the      meantime, I will put him on clear liquids.  2. Acute renal failure.  Likely, this is from dehydration.  We will      hydrate and follow.  3. Protein malnutrition secondary to abdominal pain.  We will keep the      patient nothing-by-mouth for now.  If he needs to be nothing-by-      mouth for an extended period of time, we will look into total      parenteral nutrition.  4. Hypotension, resolved with intravenous fluids, secondary to      dehydration.      Hollice Espy, M.D.  Electronically Signed     SKK/MEDQ  D:  02/24/2006  T:  02/24/2006  Job:  [id number]   cc:   Vikki Ports, M.D.  Fax: [phone]   Anselm Pancoast. Zachery Dakins, M.D.  [address], [address]  [place]  [place] [address]

## 2010-05-23 NOTE — Discharge Summary (Signed)
Ross [name], Ross [name] NO.:  [id number]   MEDICAL RECORD NO.:  [id number]          PATIENT TYPE:  INP   LOCATION:  1430                         [facility]ACILITY:  [facility]   PHYSICIAN:  Anselm Pancoast. Weatherly, M.D.DATE OF BIRTH:  04-02-1950   DATE OF ADMISSION:  03/16/2006  DATE OF DISCHARGE:  03/25/2006                               DISCHARGE SUMMARY   DISCHARGE DIAGNOSES:  1. Thrombophlebitis lower extremities, right worse than left.  2. Status post recent surgery for perforated sigmoid diverticulitis      and multiple intra-abdominal abscesses, surgery done.   HISTORY:  Ross [name] is a 60 year old overweight black male who was  admitted on March 16, 2006 with the following history.  Approximately  two weeks earlier, he had been admitted with severe lower abdominal  tenderness, found to have perforated diverticulitis with multiple intra-  abdominal abscesses and was taken to surgery.  Had frank multiple areas  of infection.  Also cholecystectomy and postoperatively originally had a  wound VAC, and was taken back to surgery, his abdomen was closed and he  was on subcutaneous heparin 500 units q.8h. and then started having some  NG bleeding through the nasogastric tube.  We switched him to the  heparin q.12h.  This was continued up until the time of his discharge  last Friday.  He returned to the office today for a wound check, about 3  days after his release.  He denied any shortness of breath, but did note  that he had swelling in his right lower extremity and he had definite  edema in the right lower extremity that was not present when he was sent  home about 3 days earlier.  He had a duplex Doppler study performed at  [facility] that showed significant embolus in the vessels of the right lower  extremity and also some superficial thrombophlebitis on the left.  The  first was the right.  I admitted him after the Doppler studies and  started him on intravenous heparin.  The  patient has done satisfactory  from his abdominal standpoint.  Colostomy is working well and he 24  hours after starting the continuous heparin was started on oral  Coumadin, but was slow to get the prothrombin time to start being  elevated.  It appears that his Coumadin is probably going to take either  10 or 7.5 mg day.  His Coumadin was 10 mg on the three preceding days  prior to his discharge and his prothrombin time is 2 on the March 25, 2006.  I have advised that we discharge him on 7.5 mg of Coumadin and  repeat a prothrombin time the next Saturday which is the Saturday after  good Friday, but I will talk with him at that time advise him on what  doses of Coumadin take.  He will follow up with Dr. Theresia Lo the  following Tuesday who will be regulating his Coumadin.  He has Vicodin  which he can take for his pain.  He is wearing above the knee TED hose.  There is no prolonged sitting and the edema  in his lower extremities  which was fairly marked for the first three or four days has definitely  decreased and after about 3 days of essentially bedrest here in the  hospital with the exception of getting up to void he was started  ambulation with the above the knee TED hose.  At no time has he had any  shortness of breath consistent with a pulmonary embolus and a chest x-  ray during this admission showed no acute disease.  He is discharged in  improved condition will follow up with me for his  wound and we plan on probably taking his colostomy down in approximately  four to six months.  The patient states that his brother who is also  approximately a similar size also had problems with diverticulitis,  multiple abscesses and also thrombophlebitis after his surgery which was  done in [place] several years ago.           ______________________________  Anselm Pancoast. Zachery Dakins, M.D.     WJW/MEDQ  D:  04/14/2006  T:  04/15/2006  Job:  [id number]   cc:   Vikki Ports, M.D.  Fax:  [phone]

## 2010-05-23 NOTE — Op Note (Signed)
NAMEELEANOR, [name] NO.:  [id number]   MEDICAL RECORD NO.:  [id number]          PATIENT TYPE:  INP   LOCATION:  0159                         FACILITY:  [name]   PHYSICIAN:  Anselm Pancoast. Weatherly, M.D.DATE OF BIRTH:  1950-01-22   DATE OF PROCEDURE:  02/25/2006  DATE OF DISCHARGE:                               OPERATIVE REPORT   PREOPERATIVE DIAGNOSIS:  Abscess of left lower quadrant with  questionable perforation of small bowel, questionable diverticulitis.   POSTOPERATIVE DIAGNOSIS:  Perforated sigmoid diverticulitis with  multiple pelvic and intra-abdominal loop abscesses, chronic  cholecystitis with stones.   OPERATION:  Exploratory laparotomy, lysis of extensive adhesions and  drainage of multiple abscesses, sigmoid colectomy with end colostomy,  Hartmann procedure and cholecystectomy.   ANESTHESIA:  General anesthesia.   SURGEON:  Alonzo Owczarzak. Zachery Dakins, M.D.   ASSISTANT:  Sharlet Salina T. Hoxworth, M.D.   HISTORY:  [name] is a 60 year old male who was admitted to the  hospitalist service on February 14 after the CT showed pericolonic  stranding on mesenteric vessels, likely diverticulitis.  White count was  15,000 and he was in the [name] for about 3 days changed over to p.o.  antibiotics and discharged.  His abdominal pain worsened.  He  returned  back to the emergency room approximately 24 hours later and a repeat CT  was ordered and found worsening inflammatory process in the lower  abdomen and whether this was a sigmoid diverticulitis or possibly small  bowel inflammation ischemic area we could not tell.  We reviewed the x-  rays with the radiologist and did a Gastrografin within the rectum,  repeat pelvic CT and this we could not actually see a perforation of the  colon.  We know the patient has got diverticulosis and it appears that  the process is worsening with more fluid, area of small bowel that  looked like questionably whether it was  viable.  The patient does not  want a colostomy unless it is absolutely necessary but with his  condition worsening, he was beginning to have fever.  He had been  started on Zosyn and Flagyl earlier this morning and permission was  obtained to proceed on with an emergency laparotomy.  I talked with the  hospitalist and they were in agreement with this and he was taken to  surgery approximately 1:30 a.m.   After induction of general anesthesia, a Foley catheter was placed and  he had a moderate amount of kind of very thick concentrated urine and  with the induction of general anesthesia, you can feel a big fullness in  the left lower quadrant that you could appreciate the fullness and  certainly appreciate the tenderness before the general anesthesia.  We  carefully opened through the midline and through the subcutaneous tissue  and thinned out muscle layer and then opened into the peritoneal cavity.  The small bowel was just matted together with multiple acute, chronic  inflammation and the loops of bowel, we extended the incision since he  was just so tight you could not actually run your hand in the abdomen  and we started breaking down these adhesions between the loops of small  bowel and then there was a big frank abscess along the root of the ileum  close to the mesentery and also the base of the sigmoid colon.  The  sigmoid colon was very edematous, got numerous appendices epiploica,  certainly feels like a sigmoid diverticulitis in spite of the contrast  not showing an actual perforation.  His appendix is kind of involved  within the inflammation but does not appear to be appendicitis.  I went  ahead and removed the appendix.  The appendiceal mesentery was divided  between [name] and tied with 2-0 silk and then the base of the appendix  was doubly tied with 2-0 Vicryl and then the appendix removed.  This to  me did not look like a ruptured appendicitis but just a significant   periappendiceal inflammation.  We then continued breaking up the intra-  abdominal loop abscesses and could not see any actual perforation of the  small-bowel.  It appears that the areas of the small bowel is related to  it being adjacent to the infection and not the cause of the infection.  With this we elected to go ahead and do a sigmoid colectomy.  We used a  contour stapler, it did not completely close the area and then we took a  second fire with a TA-60 with the thick staples to kind of close the  distal portion better since there was definitely a gap where we had  actually kind of gone through the colon when we were trying to get  contour around.  We then used the LigaSure as far as there was frank  abscesses within the mesentery of the sigmoid colon addition to this  very large wound that was adjacent to the mesentery and we transected  everything very close to the vessels to the colon wall since there was  no way we could identify the ureter in this just massive inflammation  and edema in this very large black male.  We took the distal proximally  and over to the descending colon proximal sigmoid and used a TA 60 to  close the stay side and then transected the large area.  The colon is  certainly thickened and we did not actually identify the abscess or the  perforation but I am sure this is a perforated sigmoid colon.  We then  thoroughly irrigated and aspirated with checking other quadrants of the  abdomen and found a large pus collection up around the liver.  He has  also get numerous stones, packed in his gallbladder but not that of a  true acute cholecystitis.  We thought possibly as an option that we  could possibly do a loop colostomy up in the right upper quadrant and  with doing that I think since he has got certainly the stones and I fear  that he will get acute cholecystitis, it would be best just go ahead and remove the gallbladder at this time.  We extended the  incision up about  3 inches and then Dr. Johna Sheriff went to the right side so we could  retract and work from the left taking the gallbladder down in a  retrograde manner. We did identify the cystic artery that was doubly  clipped and then divided distally and then freed the gallbladder right  down to its cystic duct and then tied the cystic duct with 2-0 silk and  also put a clip  on it.  There was good hemostasis.  We irrigated,  aspirated all this infection that we had noticed previously was gone.  Really he is tense enough and the mesentery is not going to work easily  to do a loop so we elected to go ahead and just mobilize the descending  colon better so we could bring it up as an end colostomy.  This and two  finger opening was made through the left rectum sort of at the umbilicus  level and then we could bring up the descending colon and the largest  appendices epiploica I have ever seen was then transected with the  LigaSure to allow [place] to bring it through the abdominal wall.  We then  again thoroughly irrigated.  The bowel was very edematous and I think  that there is no way that we can close the fascia and elected to put a  wound vac on him.  The plastic is actually within the peritoneal cavity  and used about the salad plate size vacuums and then held these in place  with the stickies applied appropriately about three levels, three layers  and then put the vacuum on the area so as to make a little X cruciate  incision in the midportion of the area.  I then went ahead and matured  the colostomy with interrupted sutures of 3-0 Vicryl and then placed a  colostomy bag over it.  I had placed a Blake drain in the pelvis.  This  is mainly to make sure we do not have any urine.  The actual abscesses  and all had actually been washed out, loculated released and etc..  Will  definitely leave a Blake drain in the area.  Next the small bowel of  course had been placed back in as close to  anatomic position with the  omentum brought down over it  prior to putting on the wound vac.  Sponge  count was correct.  The estimated blood loss was probably 300 mL.  There  was a little area in one of the vessels supplying the sigmoid, the  descending colon that required suturing for a little better hemostasis.  The patient was taken to the ICU intubated and will get critical care to  assist [place] in respirator and management.  If he is doing satisfactory,  hopefully we can just wait and take him back for wound vac change on  Monday and hopefully go ahead and close the fascia at that time.  I will  get a PICC line in him and go ahead and get him started on TPN in spite  of him being very heavy since it is most likely going to be a least a  week before he will actually be eating.           ______________________________  Anselm Pancoast. Zachery Dakins, M.D.     WJW/MEDQ  D:  02/25/2006  T:  02/25/2006  Job:  [id number]

## 2010-06-24 ENCOUNTER — Inpatient Hospital Stay (HOSPITAL_COMMUNITY)
Admission: EM | Admit: 2010-06-24 | Discharge: 2010-06-28 | DRG: 027 | Disposition: A | Discharge status: Home Health | Payer: No Typology Code available for payment source | Attending: Neurological Surgery | Admitting: Neurological Surgery

## 2010-06-24 ENCOUNTER — Emergency Department (HOSPITAL_COMMUNITY): Payer: No Typology Code available for payment source

## 2010-06-24 DIAGNOSIS — I2510 Atherosclerotic heart disease of native coronary artery without angina pectoris: Secondary | ICD-10-CM | POA: Diagnosis present

## 2010-06-24 DIAGNOSIS — K5730 Diverticulosis of large intestine without perforation or abscess without bleeding: Secondary | ICD-10-CM | POA: Diagnosis present

## 2010-06-24 DIAGNOSIS — Z86718 Personal history of other venous thrombosis and embolism: Secondary | ICD-10-CM

## 2010-06-24 DIAGNOSIS — I6200 Nontraumatic subdural hemorrhage, unspecified: Principal | ICD-10-CM | POA: Diagnosis present

## 2010-06-24 LAB — URINALYSIS, ROUTINE W REFLEX MICROSCOPIC
Glucose, UA: NEGATIVE mg/dL
Hgb urine dipstick: NEGATIVE
Ketones, ur: NEGATIVE mg/dL
Leukocytes, UA: NEGATIVE
Protein, ur: NEGATIVE mg/dL

## 2010-06-24 LAB — BASIC METABOLIC PANEL
Chloride: 107 mEq/L (ref 96–112)
GFR calc Af Amer: >60 mL/min (ref >60)
Potassium: 3.8 mEq/L (ref 3.5–5.1)
Sodium: 144 mEq/L (ref 135–145)

## 2010-06-24 LAB — TYPE AND SCREEN: Antibody Screen: NEGATIVE

## 2010-06-24 LAB — DIFFERENTIAL
Basophils Absolute: 0 10*3/uL (ref 0.0–0.1)
Eosinophils Relative: 1 % (ref 0–5)
Lymphocytes Relative: 26 % (ref 12–46)
Monocytes Absolute: 0.8 10*3/uL (ref 0.1–1.0)

## 2010-06-24 LAB — CBC
HCT: 41.5 % (ref 39.0–52.0)
MCHC: 35.9 g/dL (ref 30.0–36.0)
RDW: 12.3 % (ref 11.5–15.5)

## 2010-06-24 LAB — ABO/RH: ABO/RH(D): O POS

## 2010-06-25 ENCOUNTER — Inpatient Hospital Stay (HOSPITAL_COMMUNITY): Payer: No Typology Code available for payment source

## 2010-06-27 ENCOUNTER — Inpatient Hospital Stay (HOSPITAL_COMMUNITY): Payer: No Typology Code available for payment source
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
## 2010-07-07 ENCOUNTER — Other Ambulatory Visit: Payer: Self-pay | Admitting: Neurological Surgery

## 2010-07-07 DIAGNOSIS — S065X9A Traumatic subdural hemorrhage with loss of consciousness of unspecified duration, initial encounter: Secondary | ICD-10-CM

## 2010-07-21 ENCOUNTER — Observation Stay (HOSPITAL_COMMUNITY): Payer: No Typology Code available for payment source

## 2010-07-21 ENCOUNTER — Ambulatory Visit (HOSPITAL_COMMUNITY)
Admission: RE | Admit: 2010-07-21 | Discharge: 2010-07-21 | Disposition: A | Discharge status: Home / Self Care | Payer: No Typology Code available for payment source | Source: Ambulatory Visit | Attending: Sports Medicine | Admitting: Sports Medicine

## 2010-07-21 ENCOUNTER — Inpatient Hospital Stay (HOSPITAL_COMMUNITY)
Admission: AD | Admit: 2010-07-21 | Discharge: 2010-07-29 | DRG: 299 | Disposition: A | Discharge status: Home / Self Care | Payer: No Typology Code available for payment source | Source: Ambulatory Visit | Attending: Internal Medicine | Admitting: Internal Medicine

## 2010-07-21 DIAGNOSIS — E669 Obesity, unspecified: Secondary | ICD-10-CM | POA: Diagnosis present

## 2010-07-21 DIAGNOSIS — I2510 Atherosclerotic heart disease of native coronary artery without angina pectoris: Secondary | ICD-10-CM | POA: Diagnosis present

## 2010-07-21 DIAGNOSIS — I82409 Acute embolism and thrombosis of unspecified deep veins of unspecified lower extremity: Principal | ICD-10-CM | POA: Diagnosis present

## 2010-07-21 DIAGNOSIS — M79609 Pain in unspecified limb: Secondary | ICD-10-CM

## 2010-07-21 DIAGNOSIS — Z4789 Encounter for other orthopedic aftercare: Secondary | ICD-10-CM

## 2010-07-21 DIAGNOSIS — Z7982 Long term (current) use of aspirin: Secondary | ICD-10-CM

## 2010-07-21 DIAGNOSIS — S065X0A Traumatic subdural hemorrhage without loss of consciousness, initial encounter: Secondary | ICD-10-CM | POA: Diagnosis present

## 2010-07-21 DIAGNOSIS — Z86718 Personal history of other venous thrombosis and embolism: Secondary | ICD-10-CM

## 2010-07-21 DIAGNOSIS — Y998 Other external cause status: Secondary | ICD-10-CM

## 2010-07-21 DIAGNOSIS — I10 Essential (primary) hypertension: Secondary | ICD-10-CM | POA: Diagnosis present

## 2010-07-22 ENCOUNTER — Other Ambulatory Visit: Payer: No Typology Code available for payment source

## 2010-07-22 LAB — PROTIME-INR: Prothrombin Time: 15.1 seconds (ref 11.6–15.2)

## 2010-07-22 LAB — CBC
MCV: 88.2 fL (ref 78.0–100.0)
Platelets: 141 10*3/uL — ABNORMAL LOW (ref 150–400)
RBC: 3.91 MIL/uL — ABNORMAL LOW (ref 4.22–5.81)
WBC: 6.5 10*3/uL (ref 4.0–10.5)

## 2010-07-22 LAB — BASIC METABOLIC PANEL
CO2: 26 mEq/L (ref 19–32)
Calcium: 8.9 mg/dL (ref 8.4–10.5)
GFR calc Af Amer: >60 mL/min (ref >60)
GFR calc non Af Amer: >60 mL/min (ref >60)
Sodium: 142 mEq/L (ref 135–145)

## 2010-07-22 LAB — HEPARIN LEVEL (UNFRACTIONATED)
Heparin Unfractionated: <0.1 IU/mL — ABNORMAL LOW (ref 0.30–0.70)
Heparin Unfractionated: <0.1 IU/mL — ABNORMAL LOW (ref 0.30–0.70)

## 2010-07-23 LAB — BASIC METABOLIC PANEL
BUN: 8 mg/dL (ref 6–23)
Creatinine, Ser: 0.77 mg/dL (ref 0.50–1.35)
GFR calc Af Amer: >60 mL/min (ref >60)
GFR calc non Af Amer: >60 mL/min (ref >60)

## 2010-07-23 LAB — CBC
MCHC: 35.4 g/dL (ref 30.0–36.0)
RDW: 12.5 % (ref 11.5–15.5)

## 2010-07-23 LAB — HEPARIN LEVEL (UNFRACTIONATED)
Heparin Unfractionated: 1.02 IU/mL — ABNORMAL HIGH (ref 0.30–0.70)
Heparin Unfractionated: 0.91 IU/mL — ABNORMAL HIGH (ref 0.30–0.70)

## 2010-07-23 LAB — PROTIME-INR: Prothrombin Time: 15.9 seconds — ABNORMAL HIGH (ref 11.6–15.2)

## 2010-07-24 LAB — COMPREHENSIVE METABOLIC PANEL
ALT: 26 U/L (ref 0–53)
CO2: 22 mEq/L (ref 19–32)
Calcium: 9.7 mg/dL (ref 8.4–10.5)
Chloride: 104 mEq/L (ref 96–112)
GFR calc Af Amer: >60 mL/min (ref >60)
GFR calc non Af Amer: >60 mL/min (ref >60)
Glucose, Bld: 86 mg/dL (ref 70–99)
Sodium: 138 mEq/L (ref 135–145)
Total Bilirubin: 0.8 mg/dL (ref 0.3–1.2)

## 2010-07-24 LAB — CBC
MCH: 30.5 pg (ref 26.0–34.0)
MCV: 87.4 fL (ref 78.0–100.0)
Platelets: 159 10*3/uL (ref 150–400)
RBC: 4.43 MIL/uL (ref 4.22–5.81)
RDW: 12.7 % (ref 11.5–15.5)

## 2010-07-25 LAB — CBC
HCT: 38.6 % — ABNORMAL LOW (ref 39.0–52.0)
MCH: 31.2 pg (ref 26.0–34.0)
MCHC: 35.8 g/dL (ref 30.0–36.0)
MCV: 87.1 fL (ref 78.0–100.0)
RDW: 12.6 % (ref 11.5–15.5)

## 2010-07-25 LAB — PROTIME-INR: INR: 1.34 (ref 0.00–1.49)

## 2010-07-26 LAB — CBC
MCH: 31 pg (ref 26.0–34.0)
MCHC: 35.3 g/dL (ref 30.0–36.0)
MCV: 87.8 fL (ref 78.0–100.0)
Platelets: 190 10*3/uL (ref 150–400)
RBC: 4.26 MIL/uL (ref 4.22–5.81)
RDW: 12.8 % (ref 11.5–15.5)

## 2010-07-26 LAB — HEPARIN LEVEL (UNFRACTIONATED): Heparin Unfractionated: 0.41 IU/mL (ref 0.30–0.70)

## 2010-07-27 ENCOUNTER — Inpatient Hospital Stay (HOSPITAL_COMMUNITY): Payer: No Typology Code available for payment source

## 2010-07-27 LAB — CBC
HCT: 38.6 % — ABNORMAL LOW (ref 39.0–52.0)
Hemoglobin: 13.6 g/dL (ref 13.0–17.0)
RDW: 12.7 % (ref 11.5–15.5)
WBC: 5.5 10*3/uL (ref 4.0–10.5)

## 2010-07-27 LAB — PROTIME-INR: INR: 1.6 — ABNORMAL HIGH (ref 0.00–1.49)

## 2010-07-27 LAB — HEPARIN LEVEL (UNFRACTIONATED): Heparin Unfractionated: 1.05 IU/mL — ABNORMAL HIGH (ref 0.30–0.70)

## 2010-07-28 LAB — CBC
HCT: 38 % — ABNORMAL LOW (ref 39.0–52.0)
MCH: 31.6 pg (ref 26.0–34.0)
MCV: 88.2 fL (ref 78.0–100.0)
Platelets: 215 10*3/uL (ref 150–400)
RBC: 4.31 MIL/uL (ref 4.22–5.81)

## 2010-07-28 LAB — PROTIME-INR: Prothrombin Time: 20.1 seconds — ABNORMAL HIGH (ref 11.6–15.2)
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
## 2010-10-16 LAB — CBC
HCT: 31 — ABNORMAL LOW
HCT: 31.4 — ABNORMAL LOW
HCT: 31.4 — ABNORMAL LOW
HCT: 32.6 — ABNORMAL LOW
Hemoglobin: 10.8 — ABNORMAL LOW
Hemoglobin: 11.2 — ABNORMAL LOW
Hemoglobin: 11.2 — ABNORMAL LOW
MCHC: 34.4
MCHC: 34.8
MCHC: 35.3
MCHC: 35.7
MCV: 89
MCV: 89.7
MCV: 90.8
MCV: 91
Platelets: 165
Platelets: 182
RBC: 3.58 — ABNORMAL LOW
RDW: 13.3
RDW: 13.5

## 2010-10-16 LAB — PROTIME-INR
Prothrombin Time: 17.3 — ABNORMAL HIGH
Prothrombin Time: 20.8 — ABNORMAL HIGH

## 2010-10-17 LAB — PROTIME-INR: INR: 1.1

## 2010-10-17 LAB — BASIC METABOLIC PANEL
BUN: 4 — ABNORMAL LOW
BUN: 8
CO2: 26
CO2: 26
Calcium: 8.3 — ABNORMAL LOW
Calcium: 9.2
Chloride: 109
GFR calc Af Amer: >60
GFR calc non Af Amer: >60
GFR calc non Af Amer: >60
Glucose, Bld: 110 — ABNORMAL HIGH
Glucose, Bld: 138 — ABNORMAL HIGH
Glucose, Bld: 89
Potassium: 3.8
Potassium: 4.2
Sodium: 137
Sodium: 140

## 2010-10-17 LAB — CBC
HCT: 32.5 — ABNORMAL LOW
HCT: 33.1 — ABNORMAL LOW
HCT: 34.4 — ABNORMAL LOW
Hemoglobin: 11.5 — ABNORMAL LOW
Hemoglobin: 11.5 — ABNORMAL LOW
Hemoglobin: 11.9 — ABNORMAL LOW
MCHC: 34.9
MCV: 90.5
Platelets: 145 — ABNORMAL LOW
RBC: 3.61 — ABNORMAL LOW
RBC: 3.81 — ABNORMAL LOW
RDW: 13.7
RDW: 13.8
RDW: 13.9
WBC: 6.2

## 2010-12-03 ENCOUNTER — Telehealth: Payer: Self-pay | Admitting: Oncology

## 2010-12-03 NOTE — Telephone Encounter (Signed)
S/w wife today re appt for 12/4 @ 10:30 am w/FS.

## 2010-12-09 ENCOUNTER — Other Ambulatory Visit: Payer: Self-pay | Admitting: Oncology

## 2010-12-09 ENCOUNTER — Other Ambulatory Visit (HOSPITAL_BASED_OUTPATIENT_CLINIC_OR_DEPARTMENT_OTHER): Payer: No Typology Code available for payment source | Admitting: Lab

## 2010-12-09 ENCOUNTER — Telehealth: Payer: Self-pay | Admitting: Oncology

## 2010-12-09 ENCOUNTER — Ambulatory Visit (HOSPITAL_BASED_OUTPATIENT_CLINIC_OR_DEPARTMENT_OTHER): Payer: No Typology Code available for payment source | Admitting: Oncology

## 2010-12-09 ENCOUNTER — Encounter: Payer: Self-pay | Admitting: *Deleted

## 2010-12-09 ENCOUNTER — Ambulatory Visit: Payer: No Typology Code available for payment source

## 2010-12-09 VITALS — BP 121/78 | HR 46 | Temp 97.3°F | Ht 70.0 in | Wt 258.4 lb

## 2010-12-09 DIAGNOSIS — I8290 Acute embolism and thrombosis of unspecified vein: Secondary | ICD-10-CM

## 2010-12-09 DIAGNOSIS — I749 Embolism and thrombosis of unspecified artery: Secondary | ICD-10-CM

## 2010-12-09 LAB — CBC WITH DIFFERENTIAL/PLATELET
BASO%: 1 % (ref 0.0–2.0)
LYMPH%: 39.9 % (ref 14.0–49.0)
MCHC: 34.3 g/dL (ref 32.0–36.0)
MCV: 94.6 fL (ref 79.3–98.0)
MONO%: 10.1 % (ref 0.0–14.0)
NEUT#: 2.1 10*3/uL (ref 1.5–6.5)
Platelets: 172 10*3/uL (ref 140–400)
RBC: 4.47 10*6/uL (ref 4.20–5.82)
RDW: 13.2 % (ref 11.0–14.6)
WBC: 4.6 10*3/uL (ref 4.0–10.3)
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
## 2010-12-09 NOTE — Progress Notes (Signed)
Note Dictated

## 2010-12-09 NOTE — Telephone Encounter (Signed)
Made a note °

## 2010-12-12 LAB — HYPERCOAGULABLE PANEL, COMPREHENSIVE
Anticardiolipin IgG: 15 GPL U/mL (ref <23)
Beta-2 Glyco I IgG: 0 G Units (ref <20)
DRVVT 1:1 Mix: 37.5 secs (ref 34.1–42.2)
DRVVT: 52 secs — ABNORMAL HIGH (ref 34.1–42.2)
Lupus Anticoagulant: NOT DETECTED
Protein C Activity: 16 % — ABNORMAL LOW (ref 75–133)
Protein S Activity: 42 % — ABNORMAL LOW (ref 69–129)
Protein S Total: 58 % — ABNORMAL LOW (ref 60–150)

## 2010-12-12 LAB — COMPREHENSIVE METABOLIC PANEL
ALT: 24 U/L (ref 0–53)
AST: 23 U/L (ref 0–37)
Albumin: 4.4 g/dL (ref 3.5–5.2)
Alkaline Phosphatase: 56 U/L (ref 39–117)
Potassium: 3.9 mEq/L (ref 3.5–5.3)
Sodium: 139 mEq/L (ref 135–145)
Total Bilirubin: 0.9 mg/dL (ref 0.3–1.2)
Total Protein: 7 g/dL (ref 6.0–8.3)

## 2011-01-22 ENCOUNTER — Ambulatory Visit (HOSPITAL_BASED_OUTPATIENT_CLINIC_OR_DEPARTMENT_OTHER): Payer: No Typology Code available for payment source | Admitting: Oncology

## 2011-01-22 VITALS — BP 132/80 | HR 66 | Temp 97.0°F | Ht 70.0 in | Wt 264.1 lb

## 2011-01-22 DIAGNOSIS — I8290 Acute embolism and thrombosis of unspecified vein: Secondary | ICD-10-CM

## 2011-01-22 DIAGNOSIS — Z86718 Personal history of other venous thrombosis and embolism: Secondary | ICD-10-CM

## 2011-01-22 NOTE — Progress Notes (Signed)
Hematology and Oncology Follow Up Visit  Ross [name] [id number] 07-21-1950 61 y.o. 01/22/2011 4:00 PM    Principle Diagnosis: Ross [name] is a 61 year old gentleman history of DVTs. He has had 2 episodes, the first one dating back to 2008 after he had presented with acute diverticulitis, and at that time he was hospitalized at that time. The second DVT was on 07/2010 after a left ankle surgery.    Current therapy: Full dose anticoagulation with coumadin.   Interim History:  Ross [name] presents today for a follow up visit. He was seen  here on 12/09/2010 for and an evaluation for these two DVTs. He has been doing well on coumadin since 07/2010. Since the last visit, he reports no thrombotic or bleeding events. He is asymptomatic at this time. His hypercoagulable work up was discussed today and it was essentially negative. His Protein S and C  Are slightly low due to being on coumadin, but other wise all labs are normal.  Medications: I have reviewed the patient's current medications. Current outpatient prescriptions:fish oil-omega-3 fatty acids 1000 MG capsule, Take 2 g by mouth daily.  , Disp: , Rfl: ;  hydrochlorothiazide (HYDRODIURIL) 25 MG tablet, Take 25 mg by mouth daily.  , Disp: , Rfl: ;  metoprolol succinate (TOPROL-XL) 25 MG 24 hr tablet, Take 25 mg by mouth daily.  , Disp: , Rfl: ;  Multiple Vitamin (MULTIVITAMIN) capsule, Take 1 capsule by mouth daily.  , Disp: , Rfl:  warfarin (COUMADIN) 10 MG tablet, Take 10 mg by mouth daily. Except on Tuesday and thursdays patient takes 12 mg , Disp: , Rfl:   Allergies: Allergies no known allergies  Past Medical History, Surgical history, Social history, and Family History were reviewed and updated.  Review of Systems: Constitutional:  Negative for fever, chills, night sweats, anorexia, weight loss, pain. Cardiovascular: no chest pain or dyspnea on exertion Respiratory: no cough, shortness of breath, or wheezing Neurological: no TIA or stroke  symptoms Dermatological: negative ENT: negative Skin: Negative. Gastrointestinal: no abdominal pain, change in bowel habits, or black or bloody stools Genito-Urinary: no dysuria, trouble voiding, or hematuria Hematological and Lymphatic: negative Breast: negative Musculoskeletal: negative Remaining ROS negative. Physical Exam: Blood pressure 132/80, pulse 66, temperature 97 F (36.1 C), temperature source Oral, height 5\' 10"  (1.778 m), weight 264 lb 1.6 oz (119.795 kg). ECOG: 0 General appearance: alert Head: Normocephalic, without obvious abnormality, atraumatic Neck: no adenopathy, no carotid bruit, no JVD, supple, symmetrical, trachea midline and thyroid not enlarged, symmetric, no tenderness/mass/nodules Lymph nodes: Cervical, supraclavicular, and axillary nodes normal. Heart:regular rate and rhythm, S1, S2 normal, no murmur, click, rub or gallop Lung:chest clear, no wheezing, rales, normal symmetric air entry Abdomin: soft, non-tender, without masses or organomegaly EXT:no erythema, induration, or nodules   Lab Results: Lab Results  Component Value Date   WBC 4.6 12/09/2010   HGB 14.5 12/09/2010   HCT 42.3 12/09/2010   MCV 94.6 12/09/2010   PLT 172 12/09/2010     Chemistry      Component Value Date/Time   NA 139 12/09/2010 1106   K 3.9 12/09/2010 1106   CL 106 12/09/2010 1106   CO2 27 12/09/2010 1106   BUN 15 12/09/2010 1106   CREATININE 1.04 12/09/2010 1106      Component Value Date/Time   CALCIUM 9.7 12/09/2010 1106   ALKPHOS 56 12/09/2010 1106   AST 23 12/09/2010 1106   ALT 24 12/09/2010 1106   BILITOT 0.9 12/09/2010 1106  Results for Ross [name], Ross [name] (MRN [id number]) as of 01/22/2011 15:34  Ref. Range 12/09/2010 11:06  Anticardiolipin IgA Latest Range: <22 APL U/mL 5  Anticardiolipin IgG Latest Range: <23 GPL U/mL 15  Anticardiolipin IgM Latest Range: <11 MPL U/mL 1  PTT Lupus Anticoagulant Latest Range: 28.0-43.0 secs 38.6  PTTLA Confirmation Latest Range: <8.0 secs  NOT APPL  PTTLA 4:1 Mix Latest Range: 28.0-43.0 secs NOT APPL  DRVVT Latest Range: 34.1-42.2 secs 52.0 (H)  Drvvt confirmation Latest Range: <1.16 Ratio NOT APPL  Lupus Anticoagulant Latest Range: NOT DETECTED  NOT DETECTED  Beta-2 Glyco I IgG Latest Range: <20 G Units 0  Beta-2-Glycoprotein I IgA Latest Range: <20 A Units 12  Beta-2-Glycoprotein I IgM Latest Range: <20 M Units 0  AntiThromb III Func Latest Range: 76-126 % 108  Recommendations-F5LEID: No range found *  Recommendations-PTGENE: No range found *  DRVVT 1:1 Mix Latest Range: 34.1-42.2 secs 37.5  Protein C Activity Latest Range: 75-133 % 16 (L)  Protein C, Total Latest Range: 72-160 % 37 (L)  Protein S Activity Latest Range: 69-129 % 42 (L)    Impression and Plan: This is a pleasant 61 year old gentleman with the history of deep vein thrombosis on 2 separate occasions, one in 2008 after a prolonged hospitalization for  diverticulitis and abdominal abscess that required laparotomy. The second one was in 2012 after an orthopedic surgery and prolonged immobilization a trip to [place].   His Hypercoagulable panel is normal (low protein C and  S are low due to coumadin). The risks and benefits of stopping vs. continuing coumadin was discussed again in details. He is agreeable and in favour of stopping coumadin and he understands that he is at risk of developing another clot down the line.  I recommend low dose ASA 81 mg daily instead for prophylaxis purposes.    I also advised him to avoid situations that will increase his thrombotic risks such long car and plan travel (he needs to move and stretch his legs every 2 hours). He was also advised to inform his doctors if he is to have surgery about his thrombosis history to encourage early mobilizations and possible post up anticoagulations.  Follow up will be with me as needed.         Eli Hose, MD 1/17/20134:00 PM

## 2011-08-13 IMAGING — CR DG CHEST 1V PORT
1 series · 1 of 1 positions shown · non-contrast
Comparison: 03/17/2006.

CLINICAL DATA: Altered mental status.  Congestion

PORTABLE CHEST - 1 VIEW

[view not recorded]
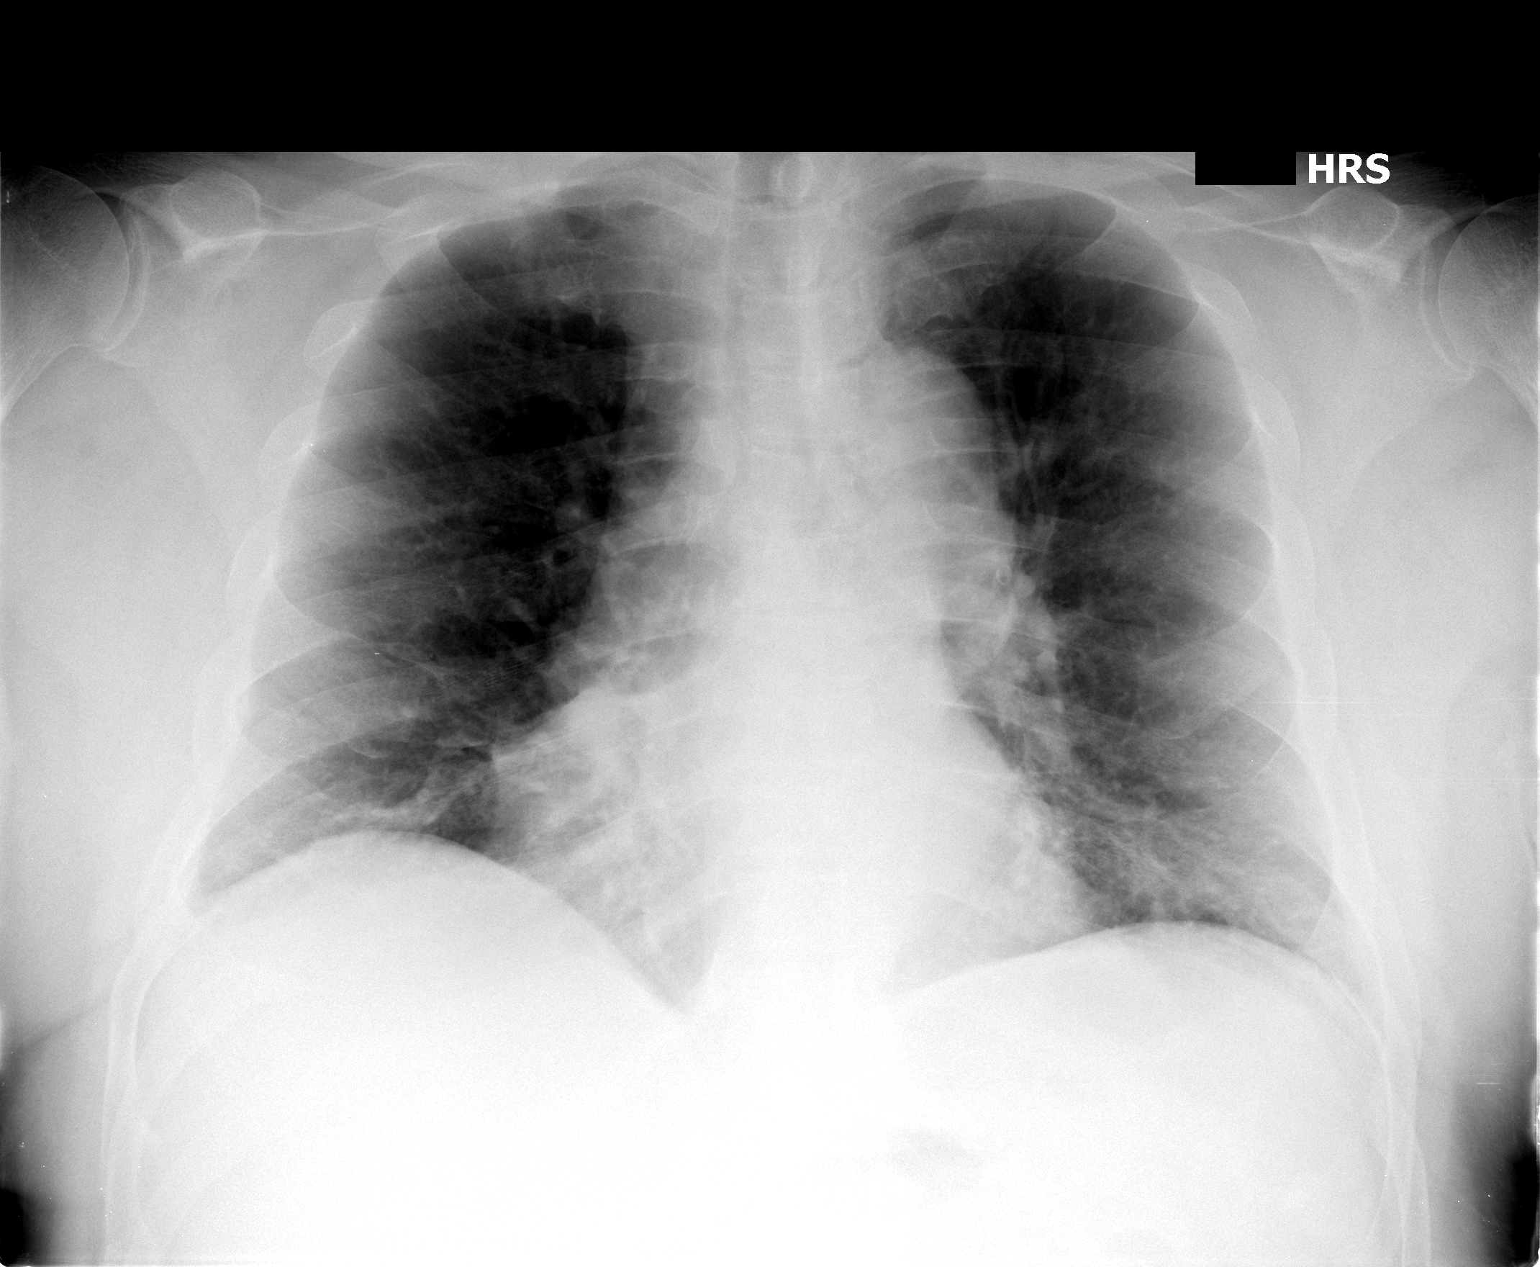

[1 of 1 positions shown; findings below may reference images not displayed]

FINDINGS: Cardiomegaly.  Mild vascular congestion.  Calcified
tortuous aorta.  No infiltrates or failure.  No effusion or
pneumothorax.
IMPRESSION: Cardiomegaly.  Mild vascular congestion. No overt failure or
pneumonia.

## 2011-08-13 IMAGING — CT CT HEAD W/O CM
1 of 2 series · 15 of 30 positions shown, 19 images · non-contrast
Comparison: 05/21/2010

CLINICAL DATA: Altered mental status

CT HEAD WITHOUT CONTRAST
TECHNIQUE: Contiguous axial images were obtained from the base of
the skull through the vertex without contrast.

[Series 3: recon 2: brain · axial · 0.47mm/px · z∈[+129,+279]mm · 15 of 64 slices shown, 19 images]
[im 4/64  brain]
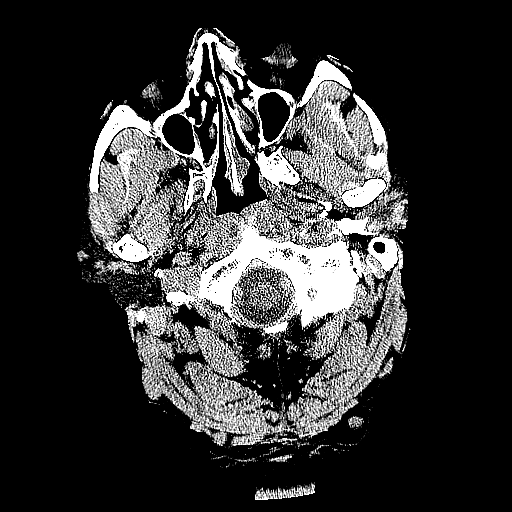
[im 4/64  bone]
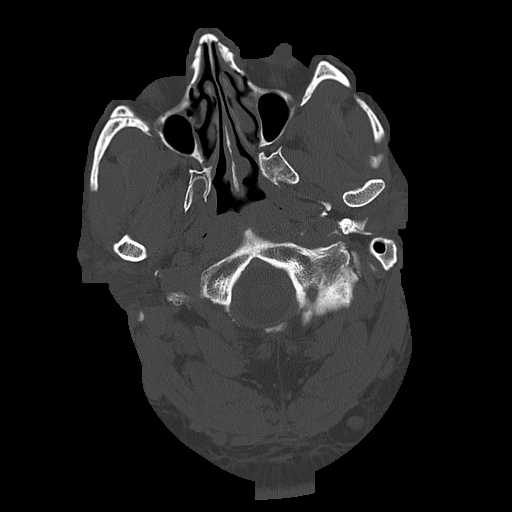
[im 7/64  brain]
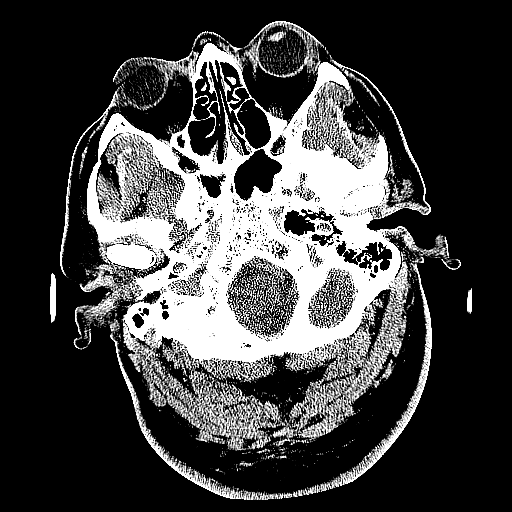
[im 14/64  brain]
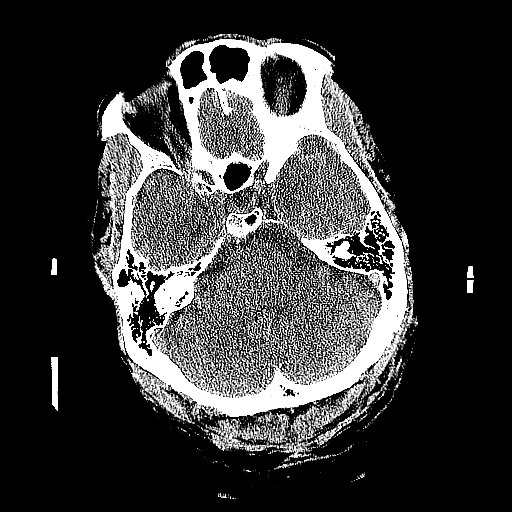
[im 17/64  brain]
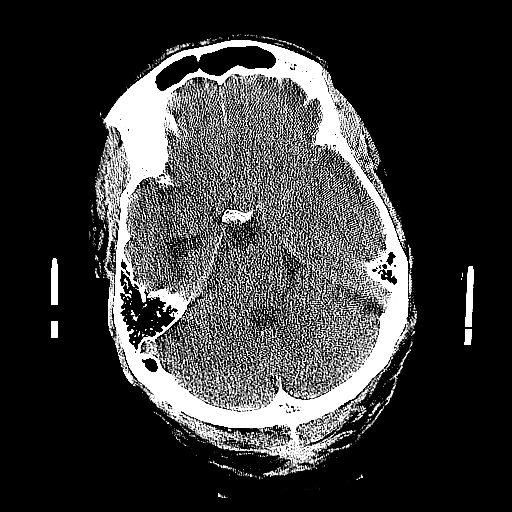
[im 20/64  brain]
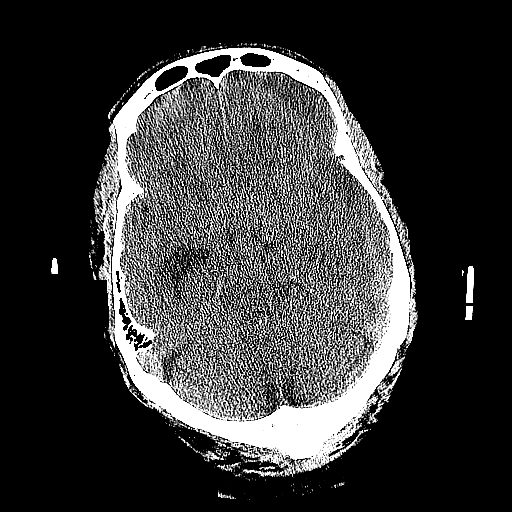
[im 20/64  bone]
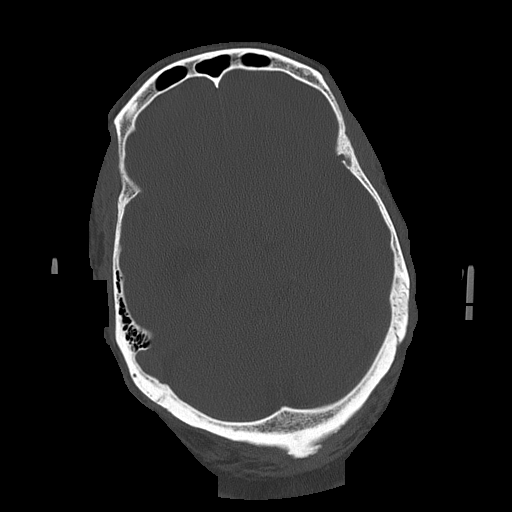
[im 24/64  brain]
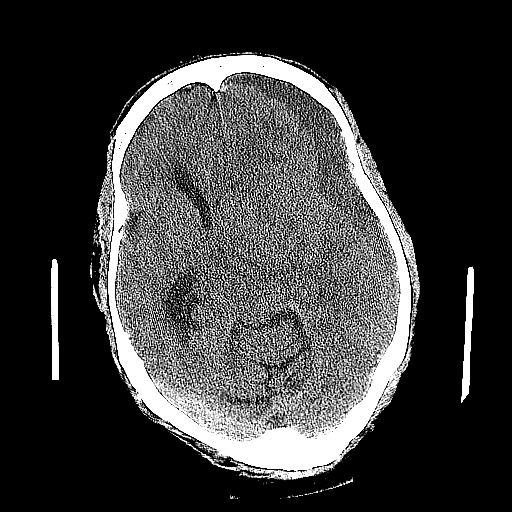
[im 27/64  brain]
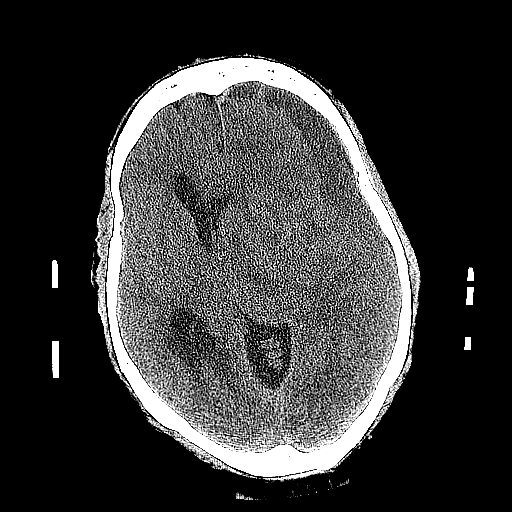
[im 34/64  brain]
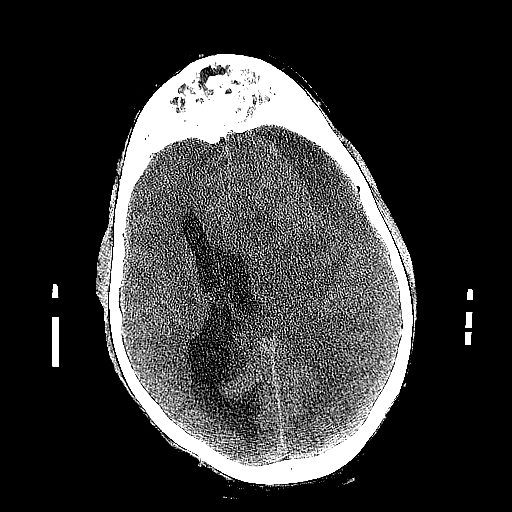
[im 37/64  brain]
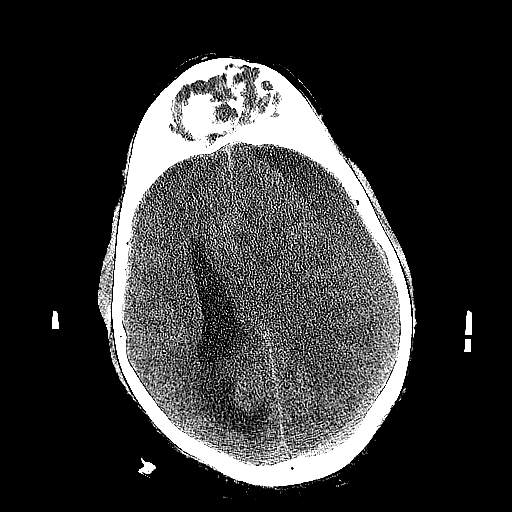
[im 37/64  bone]
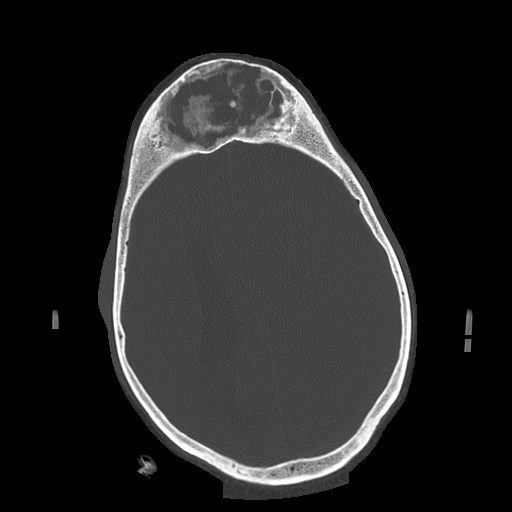
[im 40/64  brain]
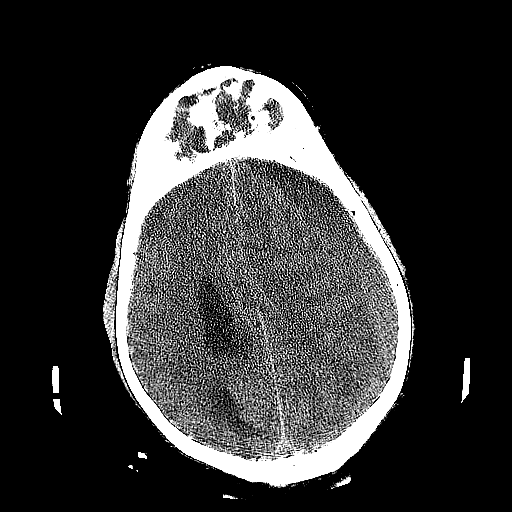
[im 44/64  brain]
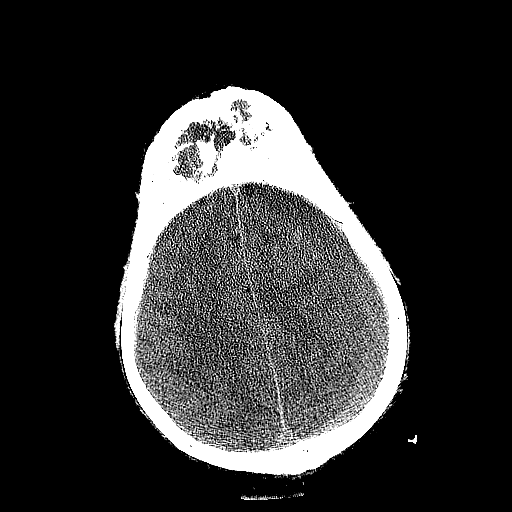
[im 47/64  brain]
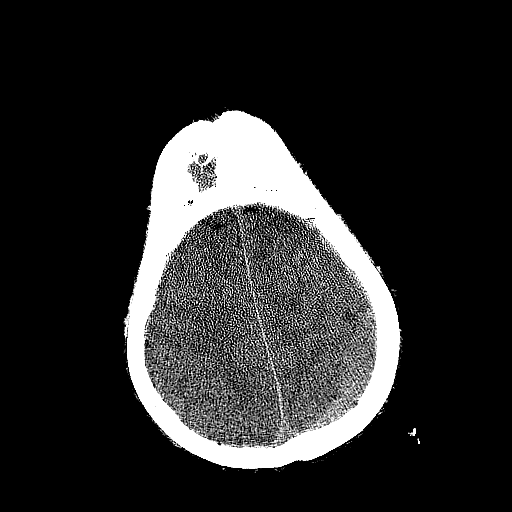
[im 54/64  brain]
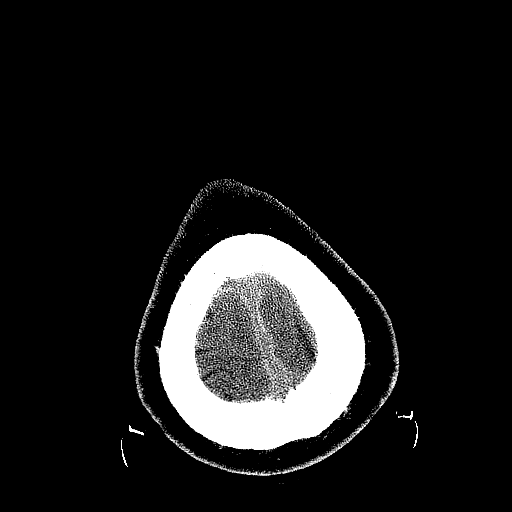
[im 54/64  bone]
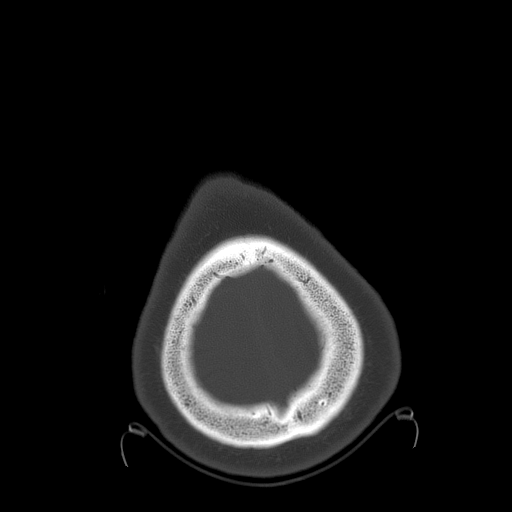
[im 57/64  brain]
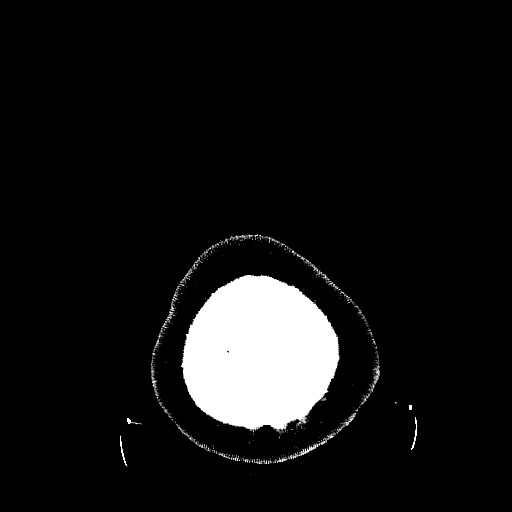
[im 60/64  brain]
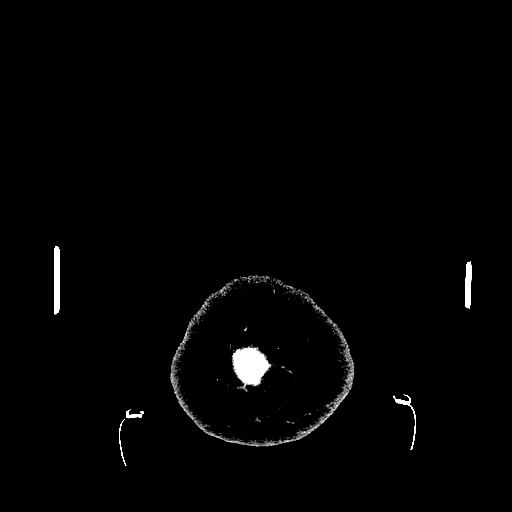

[15 of 30 positions shown; findings below may reference images not displayed]

FINDINGS: Large left subdural hematoma measures 1.7 cm, image 14.  This
overlies the right frontal and left parietal lobes.  This is of
varying density and likely reflects acute and subacute subdural
hematoma.  This is new from 05/21/2010.  There is significant mass
effect upon the adjacent brain parenchyma with left-to-right
midline shift measuring the 13 mm.

There is increased volume of the right ventricle which may reflect
a mass effect upon the third ventricle.

The cerebellar hemispheres appear normal.

The paranasal sinuses and the mastoid air cells appear clear.

The skull appears intact.

There is a large expansile lesion involving the frontal lobe with
ground glass matrix compatible with a fibrous dysplasia.  Unchanged
from previous exam.
IMPRESSION: 1.  Acute and subacute left subdural hematoma as above.
2.  Significant left-to-right midline shift with dilatation of the
right lateral ventricle. Critical Value/emergent results were
called by telephone at the time of interpretation on 06/24/2010  at
[DATE] p.m.   to  Iara Edison, who verbally acknowledged these
results.
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
## 2014-09-11 ENCOUNTER — Other Ambulatory Visit: Payer: Self-pay | Admitting: Gastroenterology

## 2016-01-06 DIAGNOSIS — K56609 Unspecified intestinal obstruction, unspecified as to partial versus complete obstruction: Secondary | ICD-10-CM

## 2016-01-06 HISTORY — DX: Unspecified intestinal obstruction, unspecified as to partial versus complete obstruction: K56.609

## 2016-01-12 ENCOUNTER — Inpatient Hospital Stay (HOSPITAL_COMMUNITY)
Admission: EM | Admit: 2016-01-12 | Discharge: 2016-01-14 | DRG: 390 | Disposition: A | Discharge status: Home / Self Care | Payer: BLUE CROSS/BLUE SHIELD | Attending: General Surgery | Admitting: General Surgery

## 2016-01-12 ENCOUNTER — Observation Stay (HOSPITAL_COMMUNITY): Payer: BLUE CROSS/BLUE SHIELD

## 2016-01-12 ENCOUNTER — Emergency Department (HOSPITAL_COMMUNITY): Payer: BLUE CROSS/BLUE SHIELD

## 2016-01-12 ENCOUNTER — Encounter (HOSPITAL_COMMUNITY): Payer: Self-pay

## 2016-01-12 DIAGNOSIS — Z0189 Encounter for other specified special examinations: Secondary | ICD-10-CM

## 2016-01-12 DIAGNOSIS — K5651 Intestinal adhesions [bands], with partial obstruction: Secondary | ICD-10-CM | POA: Diagnosis not present

## 2016-01-12 DIAGNOSIS — I10 Essential (primary) hypertension: Secondary | ICD-10-CM | POA: Diagnosis present

## 2016-01-12 DIAGNOSIS — Z86718 Personal history of other venous thrombosis and embolism: Secondary | ICD-10-CM

## 2016-01-12 DIAGNOSIS — Z7901 Long term (current) use of anticoagulants: Secondary | ICD-10-CM

## 2016-01-12 DIAGNOSIS — R109 Unspecified abdominal pain: Secondary | ICD-10-CM | POA: Diagnosis not present

## 2016-01-12 DIAGNOSIS — K56609 Unspecified intestinal obstruction, unspecified as to partial versus complete obstruction: Secondary | ICD-10-CM | POA: Diagnosis present

## 2016-01-12 HISTORY — DX: Other specified postprocedural states: Z98.890

## 2016-01-12 HISTORY — DX: Diverticulitis of intestine, part unspecified, without perforation or abscess without bleeding: K57.92

## 2016-01-12 HISTORY — DX: Unspecified intestinal obstruction, unspecified as to partial versus complete obstruction: K56.609

## 2016-01-12 LAB — URINALYSIS, ROUTINE W REFLEX MICROSCOPIC
BILIRUBIN URINE: NEGATIVE
GLUCOSE, UA: NEGATIVE mg/dL
HGB URINE DIPSTICK: NEGATIVE
KETONES UR: NEGATIVE mg/dL
Leukocytes, UA: NEGATIVE
NITRITE: NEGATIVE
Protein, ur: NEGATIVE mg/dL
Specific Gravity, Urine: 1.018 (ref 1.005–1.030)
pH: 6 (ref 5.0–8.0)

## 2016-01-12 LAB — COMPREHENSIVE METABOLIC PANEL
ALBUMIN: 4.2 g/dL (ref 3.5–5.0)
ALK PHOS: 58 U/L (ref 38–126)
ALT: 47 U/L (ref 17–63)
AST: 30 U/L (ref 15–41)
Anion gap: 9 (ref 5–15)
BILIRUBIN TOTAL: 1.2 mg/dL (ref 0.3–1.2)
BUN: 10 mg/dL (ref 6–20)
CO2: 26 mmol/L (ref 22–32)
Calcium: 9.9 mg/dL (ref 8.9–10.3)
Chloride: 104 mmol/L (ref 101–111)
Creatinine, Ser: 1.14 mg/dL (ref 0.61–1.24)
GFR calc Af Amer: >60 mL/min (ref >60)
GFR calc non Af Amer: >60 mL/min (ref >60)
GLUCOSE: 124 mg/dL — AB (ref 65–99)
POTASSIUM: 3.9 mmol/L (ref 3.5–5.1)
Sodium: 139 mmol/L (ref 135–145)
TOTAL PROTEIN: 6.9 g/dL (ref 6.5–8.1)

## 2016-01-12 LAB — CBC
HCT: 44.2 % (ref 39.0–52.0)
HEMATOCRIT: 42.5 % (ref 39.0–52.0)
Hemoglobin: 16 g/dL (ref 13.0–17.0)
Hemoglobin: 14.9 g/dL (ref 13.0–17.0)
MCH: 33.1 pg (ref 26.0–34.0)
MCH: 32.3 pg (ref 26.0–34.0)
MCHC: 36.2 g/dL — AB (ref 30.0–36.0)
MCHC: 35.1 g/dL (ref 30.0–36.0)
MCV: 91.3 fL (ref 78.0–100.0)
MCV: 92.2 fL (ref 78.0–100.0)
Platelets: 181 10*3/uL (ref 150–400)
Platelets: 183 10*3/uL (ref 150–400)
RBC: 4.84 MIL/uL (ref 4.22–5.81)
RBC: 4.61 MIL/uL (ref 4.22–5.81)
RDW: 12.5 % (ref 11.5–15.5)
RDW: 12.5 % (ref 11.5–15.5)
WBC: 8.7 10*3/uL (ref 4.0–10.5)
WBC: 8.1 10*3/uL (ref 4.0–10.5)

## 2016-01-12 LAB — CREATININE, SERUM: CREATININE: 1.05 mg/dL (ref 0.61–1.24)

## 2016-01-12 LAB — LIPASE, BLOOD: Lipase: 18 U/L (ref 11–51)

## 2016-01-12 MED ORDER — SODIUM CHLORIDE 0.9 % IV SOLN
Freq: Once | INTRAVENOUS | Status: AC
  Administered 2016-01-12: 07:00:00 via INTRAVENOUS

## 2016-01-12 MED ORDER — ENOXAPARIN SODIUM 40 MG/0.4ML ~~LOC~~ SOLN
40.0000 mg | Freq: Every day | SUBCUTANEOUS | Status: DC
  Administered 2016-01-12 – 2016-01-14 (×3): 40 mg via SUBCUTANEOUS
  Filled 2016-01-12 (×4): qty 0.4

## 2016-01-12 MED ORDER — ACETAMINOPHEN 650 MG RE SUPP: 650.0000 mg | Freq: Four times a day (QID) | RECTAL | Status: DC | PRN

## 2016-01-12 MED ORDER — DIATRIZOATE MEGLUMINE & SODIUM 66-10 % PO SOLN
90.0000 mL | Freq: Once | ORAL | Status: AC
  Administered 2016-01-12: 90 mL via NASOGASTRIC
  Filled 2016-01-12 (×2): qty 90

## 2016-01-12 MED ORDER — ONDANSETRON 4 MG PO TBDP: 4.0000 mg | ORAL_TABLET | Freq: Four times a day (QID) | ORAL | Status: DC | PRN

## 2016-01-12 MED ORDER — ACETAMINOPHEN 325 MG PO TABS: 650.0000 mg | ORAL_TABLET | Freq: Four times a day (QID) | ORAL | Status: DC | PRN

## 2016-01-12 MED ORDER — KCL IN DEXTROSE-NACL 20-5-0.45 MEQ/L-%-% IV SOLN
INTRAVENOUS | Status: DC
  Administered 2016-01-12 – 2016-01-14 (×5): via INTRAVENOUS
  Filled 2016-01-12 (×5): qty 1000

## 2016-01-12 MED ORDER — HYDROMORPHONE HCL 2 MG/ML IJ SOLN: 0.5000 mg | INTRAMUSCULAR | Status: DC | PRN

## 2016-01-12 MED ORDER — ONDANSETRON HCL 4 MG/2ML IJ SOLN: 4.0000 mg | Freq: Four times a day (QID) | INTRAMUSCULAR | Status: DC | PRN

## 2016-01-12 MED ORDER — DIPHENHYDRAMINE HCL 12.5 MG/5ML PO ELIX: 12.5000 mg | ORAL_SOLUTION | Freq: Four times a day (QID) | ORAL | Status: DC | PRN

## 2016-01-12 MED ORDER — IOPAMIDOL (ISOVUE-300) INJECTION 61%
INTRAVENOUS | Status: AC
  Administered 2016-01-12: 100 mL
  Filled 2016-01-12: qty 100

## 2016-01-12 MED ORDER — DIPHENHYDRAMINE HCL 50 MG/ML IJ SOLN: 12.5000 mg | Freq: Four times a day (QID) | INTRAMUSCULAR | Status: DC | PRN

## 2016-01-12 MED ORDER — HYDRALAZINE HCL 20 MG/ML IJ SOLN: 10.0000 mg | INTRAMUSCULAR | Status: DC | PRN

## 2016-01-12 NOTE — Progress Notes (Signed)
Pt admitted to 6N25 via bed from ED.  Pt on RA.  Pt on 20G to Rt AC with fluids infusing.  Skin intact.  Family to bedside.  Will continue to monitor.

## 2016-01-12 NOTE — ED Notes (Signed)
gen surg at bedside

## 2016-01-12 NOTE — ED Notes (Signed)
X-ray at bedside

## 2016-01-12 NOTE — ED Triage Notes (Signed)
Pt here for bloated, abd fullness and emesis. Onset today hx of 18 inch colon removed.

## 2016-01-12 NOTE — ED Provider Notes (Signed)
[facility]-EMER[facility]ENCY DEPT Provider Note   CSN: [id number] Arrival date & time: 01/12/16  0116     History   Chief Complaint Chief Complaint  Patient presents with  . Abdominal Pain  . Bloated    HPI Ross [name] is a 66 y.o. male.  Patient presents with 2 days of progressively worsening abdominal fullness and bloating. He states he feels much better after vomiting one time in the waiting room. He states he had a small bowel movement yesterday and today. Still passing gas. The states he has been on a fast for the past 2 days and eating only vegetables. Denies fever. Denies chest pain or shortness of breath. Denies any urinary symptoms. Denies testicular pain. Patient states history of previous bowel resection from diverticulitis. He had a colostomy which has since been reversed. He still feels somewhat full in his abdomen but he states his symptoms are much improved after he vomited one time.   The history is provided by the patient.  Abdominal Pain   Associated symptoms include nausea and vomiting. Pertinent negatives include fever, diarrhea, constipation, dysuria, hematuria, headaches, arthralgias and myalgias.    Past Medical History:  Diagnosis Date  . Diverticulitis   . Hx of brain surgery     There are no active problems to display for this patient.   Past Surgical History:  Procedure Laterality Date  . BRAIN SURGERY    . GASTRECTOMY         Home Medications    Prior to Admission medications   Medication Sig Start Date End Date Taking? Authorizing Provider  fish oil-omega-3 fatty acids 1000 MG capsule Take 2 g by mouth daily.      Historical Provider, MD  hydrochlorothiazide (HYDRODIURIL) 25 MG tablet Take 25 mg by mouth daily.      Historical Provider, MD  metoprolol succinate (TOPROL-XL) 25 MG 24 hr tablet Take 25 mg by mouth daily.      Historical Provider, MD  Multiple Vitamin (MULTIVITAMIN) capsule Take 1 capsule by mouth daily.      Historical Provider, MD    warfarin (COUMADIN) 10 MG tablet Take 10 mg by mouth daily. Except on Tuesday and thursdays patient takes 12 mg     Historical Provider, MD    Family History History reviewed. No pertinent family history.  Social History Social History  Substance Use Topics  . Smoking status: Never Smoker  . Smokeless tobacco: Not on file  . Alcohol use No     Allergies   Patient has no known allergies.   Review of Systems Review of Systems  Constitutional: Negative for activity change, appetite change and fever.  HENT: Negative for congestion.   Respiratory: Negative for cough, chest tightness and shortness of breath.   Cardiovascular: Negative for chest pain.  Gastrointestinal: Positive for abdominal pain, nausea and vomiting. Negative for constipation and diarrhea.  Genitourinary: Negative for dysuria, hematuria and testicular pain.  Musculoskeletal: Negative for arthralgias and myalgias.  Neurological: Negative for dizziness, weakness, light-headedness and headaches.   A complete 10 system review of systems was obtained and all systems are negative except as noted in the HPI and PMH.    Physical Exam Updated Vital Signs BP 132/81   Pulse 62   Temp 98.2 F (36.8 C) (Oral)   Resp 18   SpO2 96%   Physical Exam  Constitutional: He is oriented to person, place, and time. He appears well-developed and well-nourished. No distress.  HENT:  Head: Normocephalic and atraumatic.  Mouth/Throat: Oropharynx is clear and moist. No oropharyngeal exudate.  Skull deformity from previous surgery  Eyes: Conjunctivae and EOM are normal. Pupils are equal, round, and reactive to light.  Neck: Normal range of motion. Neck supple.  No meningismus.  Cardiovascular: Normal rate, regular rhythm, normal heart sounds and intact distal pulses.   No murmur heard. Pulmonary/Chest: Effort normal and breath sounds normal. No respiratory distress.  Abdominal: Soft. He exhibits distension. There is no  tenderness. There is no rebound and no guarding.  Obese, mildly distended.  Soft. Diffuse tenderness, no guarding or rebound  Musculoskeletal: Normal range of motion. He exhibits no edema or tenderness.  Neurological: He is alert and oriented to person, place, and time. No cranial nerve deficit. He exhibits normal muscle tone. Coordination normal.  No ataxia on finger to nose bilaterally. No pronator drift. 5/5 strength throughout. CN 2-12 intact.Equal grip strength. Sensation intact.   Skin: Skin is warm.  Psychiatric: He has a normal mood and affect. His behavior is normal.  Nursing note and vitals reviewed.    ED Treatments / Results  Labs (all labs ordered are listed, but only abnormal results are displayed) Labs Reviewed  COMPREHENSIVE METABOLIC PANEL - Abnormal; Notable for the following:       Result Value   Glucose, Bld 124 (*)    All other components within normal limits  CBC - Abnormal; Notable for the following:    MCHC 36.2 (*)    All other components within normal limits  URINALYSIS, ROUTINE W REFLEX MICROSCOPIC - Abnormal; Notable for the following:    APPearance HAZY (*)    All other components within normal limits  LIPASE, BLOOD  CBC  CREATININE, SERUM    EKG  EKG Interpretation None       Radiology Ct Abdomen Pelvis W Contrast  Result Date: 01/12/2016 CLINICAL DATA:  Vomiting and bloating today. EXAM: CT ABDOMEN AND PELVIS WITH CONTRAST TECHNIQUE: Multidetector CT imaging of the abdomen and pelvis was performed using the standard protocol following bolus administration of intravenous contrast. CONTRAST:  ISOVUE-300 IOPAMIDOL (ISOVUE-300) INJECTION 61% COMPARISON:  05/21/2010 FINDINGS: Lower chest: No acute abnormality. Hepatobiliary: No focal liver abnormality is seen. Status post cholecystectomy. No biliary dilatation. Pancreas: Unremarkable. No pancreatic ductal dilatation or surrounding inflammatory changes. Spleen: Normal in size without focal  abnormality. Adrenals/Urinary Tract: Stable 1.8 cm low-attenuation nodule of the left adrenal, unchanged from 05/21/2010. Right adrenal is normal. Kidneys, collecting systems, ureters and urinary bladder are normal. Stomach/Bowel: Stomach is unremarkable. There is abnormal dilatation and mural thickening of proximal to mid small bowel. There is caliber transition to decompressed ileum. Unfortunately, there is significant motion degradation of the images in the mid abdomen, obscuring visualization of the transition point. On this motion degraded study, no mass or hernia is evident at the caliber transition. Colon is unremarkable. Vascular/Lymphatic: The abdominal aorta is normal in caliber with minimal atherosclerotic calcification. No adenopathy is evident in the abdomen or pelvis. Reproductive: Unremarkable Other: Small volume ascites adjacent to the dilated small bowel loops. Musculoskeletal: No significant skeletal lesion. IMPRESSION: Abnormal dilatation and mural thickening of small bowel with caliber transition to decompressed ileum. This likely represents small bowel obstruction due to adhesions but the study is mildly limited due to motion artifact. Electronically Signed   By: Ellery Plunk M.D.   On: 01/12/2016 06:24   Dg Abd Portable 1v-small Bowel Protocol-position Verification  Result Date: 01/12/2016 CLINICAL DATA:  66 year old male status post nasogastric tube placement. EXAM: PORTABLE  ABDOMEN - 1 VIEW COMPARISON:  Abdominal radiograph 03/01/2006. FINDINGS: Nasogastric tube is coiled in the proximal stomach. Visualized bowel gas pattern is nonobstructive. Surgical clips project over the right upper quadrant of the abdomen related to prior cholecystectomy. IMPRESSION: 1. Tip of nasogastric tube is coiled in the proximal stomach. Electronically Signed   By: Trudie Reedaniel  Entrikin M.D.   On: 01/12/2016 08:51    Procedures Procedures (including critical care time)  Medications Ordered in  ED Medications - No data to display   Initial Impression / Assessment and Plan / ED Course  I have reviewed the triage vital signs and the nursing notes.  Pertinent labs & imaging results that were available during my care of the patient were reviewed by me and considered in my medical decision making (see chart for details).  Clinical Course   Abdominal fullness with nausea and vomiting and decreased bowel movements.  Given surgical history concern for possible bowel obstruction. Labs reassuring.  CT with SBO.  IVF continued, NG placed, d/w surgery.  Patient states no longer on coumadin.    Final Clinical Impressions(s) / ED Diagnoses   Final diagnoses:  Small bowel obstruction    New Prescriptions New Prescriptions   No medications on file     Glynn OctaveStephen Jaylee Lantry, MD 01/12/16 [phone]

## 2016-01-12 NOTE — ED Notes (Signed)
Attempted report 

## 2016-01-12 NOTE — ED Notes (Signed)
ED Provider at bedside. 

## 2016-01-12 NOTE — ED Notes (Signed)
Pt ambulated through hallway without issue.

## 2016-01-12 NOTE — ED Notes (Signed)
Patient transported to CT 
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
## 2016-01-13 ENCOUNTER — Inpatient Hospital Stay (HOSPITAL_COMMUNITY): Payer: BLUE CROSS/BLUE SHIELD

## 2016-01-13 ENCOUNTER — Encounter (HOSPITAL_COMMUNITY): Payer: Self-pay | Admitting: General Practice

## 2016-01-13 LAB — BASIC METABOLIC PANEL
ANION GAP: 6 (ref 5–15)
BUN: 6 mg/dL (ref 6–20)
CO2: 28 mmol/L (ref 22–32)
Calcium: 9.1 mg/dL (ref 8.9–10.3)
Chloride: 106 mmol/L (ref 101–111)
Creatinine, Ser: 1.09 mg/dL (ref 0.61–1.24)
GFR calc Af Amer: >60 mL/min (ref >60)
GLUCOSE: 110 mg/dL — AB (ref 65–99)
POTASSIUM: 3.8 mmol/L (ref 3.5–5.1)
Sodium: 140 mmol/L (ref 135–145)

## 2016-01-13 MED ORDER — METOPROLOL SUCCINATE ER 25 MG PO TB24
25.0000 mg | ORAL_TABLET | Freq: Every day | ORAL | Status: DC
  Administered 2016-01-13 – 2016-01-14 (×2): 25 mg via ORAL
  Filled 2016-01-13 (×2): qty 1

## 2016-01-13 MED ORDER — KETOROLAC TROMETHAMINE 0.5 % OP SOLN
1.0000 [drp] | Freq: Two times a day (BID) | OPHTHALMIC | Status: DC
  Administered 2016-01-13 – 2016-01-14 (×2): 1 [drp] via OPHTHALMIC
  Filled 2016-01-13: qty 3

## 2016-01-13 MED ORDER — DIFLUPREDNATE 0.05 % OP EMUL: 1.0000 [drp] | Freq: Every day | OPHTHALMIC | Status: DC

## 2016-01-13 NOTE — Progress Notes (Signed)
NGT was removed this morning, tolerated clear liquid diet. No nausea, no vomiting. Denies abd pain.

## 2016-01-13 NOTE — Progress Notes (Signed)
Subjective: No nausea or vomiting, + BM, + flatus  He feels much better.    Objective: Vital signs in last 24 hours: Temp:  [97.9 F (36.6 C)-98.4 F (36.9 C)] 98.3 F (36.8 C) (01/08 0538) Pulse Rate:  [52-63] 55 (01/08 0538) Resp:  [14-18] 17 (01/08 0538) BP: (120-161)/(63-85) 151/84 (01/08 0538) SpO2:  [95 %-99 %] 95 % (01/08 0538) Last BM Date: 01/11/16 240 PO 1663 IV fluids Urine 1825 NG 450 BM x 1 Afebrile, BP up some Labs OK Post film 6 PM yesterday:  Mild small bowel dilation consistent with a low grade partial Obstruction. Contrast was in the colon Intake/Output from previous day: 01/07 0701 - 01/08 0700 In: 1933.3 [P.O.:240; I.V.:1663.3; NG/GT:30] Out: 2375 [Urine:1825; Emesis/NG output:550] Intake/Output this shift: No intake/output data recorded.  General appearance: alert, cooperative, no distress and NG small, clear fluid, uncomfortable with NG Resp: clear to auscultation bilaterally GI: soft, large abdomen, no pain, + Flatus, + BS, + BM  Lab Results:   Recent Labs  01/12/16 0128 01/12/16 0828  WBC 8.7 8.1  HGB 16.0 14.9  HCT 44.2 42.5  PLT 181 183    BMET  Recent Labs  01/12/16 0128 01/12/16 0828 01/13/16 0423  NA 139  --  140  K 3.9  --  3.8  CL 104  --  106  CO2 26  --  28  GLUCOSE 124*  --  110*  BUN 10  --  6  CREATININE 1.14 1.05 1.09  CALCIUM 9.9  --  9.1   PT/INR No results for input(s): LABPROT, INR in the last 72 hours.   Recent Labs Lab 01/12/16 0128  AST 30  ALT 47  ALKPHOS 58  BILITOT 1.2  PROT 6.9  ALBUMIN 4.2     Lipase     Component Value Date/Time   LIPASE 18 01/12/2016 0128     Studies/Results: Ct Abdomen Pelvis W Contrast  Result Date: 01/12/2016 CLINICAL DATA:  Vomiting and bloating today. EXAM: CT ABDOMEN AND PELVIS WITH CONTRAST TECHNIQUE: Multidetector CT imaging of the abdomen and pelvis was performed using the standard protocol following bolus administration of intravenous contrast.  CONTRAST:  ISOVUE-300 IOPAMIDOL (ISOVUE-300) INJECTION 61% COMPARISON:  05/21/2010 FINDINGS: Lower chest: No acute abnormality. Hepatobiliary: No focal liver abnormality is seen. Status post cholecystectomy. No biliary dilatation. Pancreas: Unremarkable. No pancreatic ductal dilatation or surrounding inflammatory changes. Spleen: Normal in size without focal abnormality. Adrenals/Urinary Tract: Stable 1.8 cm low-attenuation nodule of the left adrenal, unchanged from 05/21/2010. Right adrenal is normal. Kidneys, collecting systems, ureters and urinary bladder are normal. Stomach/Bowel: Stomach is unremarkable. There is abnormal dilatation and mural thickening of proximal to mid small bowel. There is caliber transition to decompressed ileum. Unfortunately, there is significant motion degradation of the images in the mid abdomen, obscuring visualization of the transition point. On this motion degraded study, no mass or hernia is evident at the caliber transition. Colon is unremarkable. Vascular/Lymphatic: The abdominal aorta is normal in caliber with minimal atherosclerotic calcification. No adenopathy is evident in the abdomen or pelvis. Reproductive: Unremarkable Other: Small volume ascites adjacent to the dilated small bowel loops. Musculoskeletal: No significant skeletal lesion. IMPRESSION: Abnormal dilatation and mural thickening of small bowel with caliber transition to decompressed ileum. This likely represents small bowel obstruction due to adhesions but the study is mildly limited due to motion artifact. Electronically Signed   By: Ellery Plunk M.D.   On: 01/12/2016 06:24   Dg Abd Portable 1v-small Bowel  Obstruction Protocol-initial, 8 Hr Delay  Result Date: 01/12/2016 CLINICAL DATA:  Pt has SBO; delayed film. Pt has extensive hx. Pt states he is feeling much better than when he was admitted yesterday. Pt is now able to pass flatus but no BM yet. EXAM: PORTABLE ABDOMEN - 1 VIEW COMPARISON:   01/12/2016 at 8:32 a.m.  CT, 01/12/2016. FINDINGS: There mildly dilated loops of small bowel consistent with a low grade partial obstruction. Contrast is seen filling a normal caliber colon. Nasal/ orogastric tube curls in the upper stomach, stable from the earlier study. IMPRESSION: 1. Mild small bowel dilation consistent with a low grade partial obstruction. Electronically Signed   By: Amie Portlandavid  Ormond M.D.   On: 01/12/2016 18:18   Dg Abd Portable 1v-small Bowel Protocol-position Verification  Result Date: 01/12/2016 CLINICAL DATA:  66 year old male status post nasogastric tube placement. EXAM: PORTABLE ABDOMEN - 1 VIEW COMPARISON:  Abdominal radiograph 03/01/2006. FINDINGS: Nasogastric tube is coiled in the proximal stomach. Visualized bowel gas pattern is nonobstructive. Surgical clips project over the right upper quadrant of the abdomen related to prior cholecystectomy. IMPRESSION: 1. Tip of nasogastric tube is coiled in the proximal stomach. Electronically Signed   By: Trudie Reedaniel  Entrikin M.D.   On: 01/12/2016 08:51   Prior to Admission medications   Medication Sig Start Date End Date Taking? Authorizing Provider  aspirin EC 81 MG tablet Take 81 mg by mouth daily.   Yes Historical Provider, MD  Bromfenac Sodium (PROLENSA) 0.07 % SOLN Place 1 drop into the left eye 2 (two) times daily.   Yes Historical Provider, MD  Difluprednate (DUREZOL) 0.05 % EMUL Place 1 drop into the left eye daily.   Yes Historical Provider, MD  hydrochlorothiazide (HYDRODIURIL) 25 MG tablet Take 25 mg by mouth daily.     Yes Historical Provider, MD  metoprolol succinate (TOPROL-XL) 25 MG 24 hr tablet Take 25 mg by mouth daily.     Yes Historical Provider, MD  Multiple Vitamin (MULTIVITAMIN) capsule Take 1 capsule by mouth daily.     Yes Historical Provider, MD  PRESCRIPTION MEDICATION Place 1 drop into the left eye 2 (two) times daily. [place] eye drops for pressure   Yes Historical Provider, MD    Medications: . enoxaparin  (LOVENOX) injection  40 mg Subcutaneous Daily   . dextrose 5 % and 0.45 % NaCl with KCl 20 mEq/L 100 mL/hr at 01/13/16 [id number]    Assessment/Plan SBO - SB protocol Hypertension Hx of DVT FEN:  IV fluids/NPo ID:  No abx DVT:  Lovenox    Plan:  Clamp NG recheck film, if OK d/c NG and start clears.  Restart Toprol     LOS: 1 day    Cheryl Stabenow 01/13/2016 [id number] [phone]

## 2016-01-13 NOTE — Progress Notes (Signed)
Patient OOB for shower, ambulatory full hall lap X 10 (independent), and NG clamped (1hr). Patient tolerated events well, noted large loose stool this a.m., patient reports few semi formed portions.

## 2016-01-14 MED ORDER — ACETAMINOPHEN 325 MG PO TABS: 650.0000 mg | ORAL_TABLET | Freq: Four times a day (QID) | ORAL | Status: DC | PRN
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
## 2016-01-14 NOTE — Progress Notes (Signed)
Subjective: He reports feeling well with 4 Bm's yesterday, no issue with clears.  Objective: Vital signs in last 24 hours: Temp:  [97.9 F (36.6 C)-98.6 F (37 C)] 98.5 F (36.9 C) (01/09 0513) Pulse Rate:  [56-64] 64 (01/09 0513) Resp:  [17-18] 17 (01/09 0513) BP: (119-162)/(75-80) 119/75 (01/09 0513) SpO2:  [98 %] 98 % (01/09 0513) Last BM Date: 01/13/16 220 PO Voided x 3 No bm recorded Afebrile, VSS No labs Intake/Output from previous day: 01/08 0701 - 01/09 0700 In: 820 [P.O.:220; I.V.:600] Out: 0  Intake/Output this shift: Total I/O In: -  Out: 300 [Urine:300]  General appearance: alert, cooperative and no distress GI: soft, non-tender; bowel sounds normal; no masses,  no organomegaly  Lab Results:   Recent Labs  01/12/16 0128 01/12/16 0828  WBC 8.7 8.1  HGB 16.0 14.9  HCT 44.2 42.5  PLT 181 183    BMET  Recent Labs  01/12/16 0128 01/12/16 0828 01/13/16 0423  NA 139  --  140  K 3.9  --  3.8  CL 104  --  106  CO2 26  --  28  GLUCOSE 124*  --  110*  BUN 10  --  6  CREATININE 1.14 1.05 1.09  CALCIUM 9.9  --  9.1   PT/INR No results for input(s): LABPROT, INR in the last 72 hours.   Recent Labs Lab 01/12/16 0128  AST 30  ALT 47  ALKPHOS 58  BILITOT 1.2  PROT 6.9  ALBUMIN 4.2     Lipase     Component Value Date/Time   LIPASE 18 01/12/2016 0128     Studies/Results: Dg Abd 1 View  Result Date: 01/13/2016 CLINICAL DATA:  Small bowel obstruction. EXAM: ABDOMEN - 1 VIEW COMPARISON:  Radiographs dated 01/12/2016 FINDINGS: NG tube tip is in the fundus of the stomach. There is air scattered throughout minimally prominent small bowel loops, none significantly distended. Contrast has passed into the nondistended colon with evacuation of much of the contrast since the prior study. IMPRESSION: No residual small bowel dilatation. Electronically Signed   By: Francene BoyersJames  Maxwell M.D.   On: 01/13/2016 13:33   Dg Abd Portable 1v-small Bowel  Obstruction Protocol-initial, 8 Hr Delay  Result Date: 01/12/2016 CLINICAL DATA:  Pt has SBO; delayed film. Pt has extensive hx. Pt states he is feeling much better than when he was admitted yesterday. Pt is now able to pass flatus but no BM yet. EXAM: PORTABLE ABDOMEN - 1 VIEW COMPARISON:  01/12/2016 at 8:32 a.m.  CT, 01/12/2016. FINDINGS: There mildly dilated loops of small bowel consistent with a low grade partial obstruction. Contrast is seen filling a normal caliber colon. Nasal/ orogastric tube curls in the upper stomach, stable from the earlier study. IMPRESSION: 1. Mild small bowel dilation consistent with a low grade partial obstruction. Electronically Signed   By: Amie Portlandavid  Ormond M.D.   On: 01/12/2016 18:18   Dg Abd Portable 1v-small Bowel Protocol-position Verification  Result Date: 01/12/2016 CLINICAL DATA:  66 year old male status post nasogastric tube placement. EXAM: PORTABLE ABDOMEN - 1 VIEW COMPARISON:  Abdominal radiograph 03/01/2006. FINDINGS: Nasogastric tube is coiled in the proximal stomach. Visualized bowel gas pattern is nonobstructive. Surgical clips project over the right upper quadrant of the abdomen related to prior cholecystectomy. IMPRESSION: 1. Tip of nasogastric tube is coiled in the proximal stomach. Electronically Signed   By: Trudie Reedaniel  Entrikin M.D.   On: 01/12/2016 08:51    Medications: Marland Kitchen. Difluprednate  1 drop Left Eye  Daily  . enoxaparin (LOVENOX) injection  40 mg Subcutaneous Daily  . ketorolac  1 drop Left Eye BID  . metoprolol succinate  25 mg Oral Daily    Assessment/Plan SBO - SB protocol Hypertension Hx of DVT FEN:  IV fluids/clears ID:  No abx DVT:  Lovenox    Plan:  Regular diet and home later this AM.    LOS: 2 days    Hermann Dottavio 01/14/2016 [phone]

## 2016-01-14 NOTE — Care Management Note (Signed)
Case Management Note  Patient Details  Name: Ross [name] MRN: [id number] Date of Birth: 07/11/1950  Subjective/Objective:                    Action/Plan:   Expected Discharge Date:                  Expected Discharge Plan:  Home/Self Care  In-House Referral:     Discharge planning Services     Post Acute Care Choice:    Choice offered to:     DME Arranged:    DME Agency:     HH Arranged:    HH Agency:     Status of Service:  Completed, signed off  If discussed at [employer] Length of Stay Meetings, dates discussed:    Additional Comments:  Kingsley PlanWile, [name] Marie, RN 01/14/2016, 11:15 AM

## 2016-11-20 DIAGNOSIS — E669 Obesity, unspecified: Secondary | ICD-10-CM | POA: Diagnosis not present

## 2016-11-20 DIAGNOSIS — I10 Essential (primary) hypertension: Secondary | ICD-10-CM | POA: Diagnosis not present

## 2016-11-20 DIAGNOSIS — H409 Unspecified glaucoma: Secondary | ICD-10-CM | POA: Diagnosis not present

## 2016-11-20 DIAGNOSIS — E782 Mixed hyperlipidemia: Secondary | ICD-10-CM | POA: Diagnosis not present

## 2016-12-03 DIAGNOSIS — H4042X2 Glaucoma secondary to eye inflammation, left eye, moderate stage: Secondary | ICD-10-CM | POA: Diagnosis not present

## 2017-01-04 DIAGNOSIS — H4042X2 Glaucoma secondary to eye inflammation, left eye, moderate stage: Secondary | ICD-10-CM | POA: Diagnosis not present

## 2017-03-22 DIAGNOSIS — I10 Essential (primary) hypertension: Secondary | ICD-10-CM | POA: Diagnosis not present

## 2017-03-22 DIAGNOSIS — H409 Unspecified glaucoma: Secondary | ICD-10-CM | POA: Diagnosis not present

## 2017-03-22 DIAGNOSIS — E669 Obesity, unspecified: Secondary | ICD-10-CM | POA: Diagnosis not present

## 2017-03-22 DIAGNOSIS — Z125 Encounter for screening for malignant neoplasm of prostate: Secondary | ICD-10-CM | POA: Diagnosis not present

## 2017-03-22 DIAGNOSIS — E782 Mixed hyperlipidemia: Secondary | ICD-10-CM | POA: Diagnosis not present

## 2017-04-06 DIAGNOSIS — H4042X2 Glaucoma secondary to eye inflammation, left eye, moderate stage: Secondary | ICD-10-CM | POA: Diagnosis not present

## 2017-07-19 DIAGNOSIS — H4042X2 Glaucoma secondary to eye inflammation, left eye, moderate stage: Secondary | ICD-10-CM | POA: Diagnosis not present

## 2017-07-19 DIAGNOSIS — H43822 Vitreomacular adhesion, left eye: Secondary | ICD-10-CM | POA: Diagnosis not present

## 2017-08-02 DIAGNOSIS — H4042X2 Glaucoma secondary to eye inflammation, left eye, moderate stage: Secondary | ICD-10-CM | POA: Diagnosis not present

## 2017-08-18 DIAGNOSIS — H3582 Retinal ischemia: Secondary | ICD-10-CM | POA: Diagnosis not present

## 2017-08-20 DIAGNOSIS — E782 Mixed hyperlipidemia: Secondary | ICD-10-CM | POA: Diagnosis not present

## 2017-08-20 DIAGNOSIS — Z79899 Other long term (current) drug therapy: Secondary | ICD-10-CM | POA: Diagnosis not present

## 2017-08-20 DIAGNOSIS — R739 Hyperglycemia, unspecified: Secondary | ICD-10-CM | POA: Diagnosis not present

## 2017-08-20 DIAGNOSIS — H409 Unspecified glaucoma: Secondary | ICD-10-CM | POA: Diagnosis not present

## 2017-08-20 DIAGNOSIS — I10 Essential (primary) hypertension: Secondary | ICD-10-CM | POA: Diagnosis not present

## 2017-09-08 DIAGNOSIS — H43811 Vitreous degeneration, right eye: Secondary | ICD-10-CM | POA: Diagnosis not present

## 2017-09-12 ENCOUNTER — Emergency Department (HOSPITAL_COMMUNITY): Payer: BLUE CROSS/BLUE SHIELD

## 2017-09-12 ENCOUNTER — Other Ambulatory Visit: Payer: Self-pay

## 2017-09-12 ENCOUNTER — Emergency Department (HOSPITAL_BASED_OUTPATIENT_CLINIC_OR_DEPARTMENT_OTHER): Payer: BLUE CROSS/BLUE SHIELD

## 2017-09-12 ENCOUNTER — Emergency Department (HOSPITAL_COMMUNITY)
Admission: EM | Admit: 2017-09-12 | Discharge: 2017-09-12 | Disposition: A | Discharge status: Home / Self Care | Payer: BLUE CROSS/BLUE SHIELD | Attending: Emergency Medicine | Admitting: Emergency Medicine

## 2017-09-12 ENCOUNTER — Encounter (HOSPITAL_COMMUNITY): Payer: Self-pay | Admitting: Emergency Medicine

## 2017-09-12 DIAGNOSIS — R609 Edema, unspecified: Secondary | ICD-10-CM | POA: Diagnosis not present

## 2017-09-12 DIAGNOSIS — Z79899 Other long term (current) drug therapy: Secondary | ICD-10-CM | POA: Diagnosis not present

## 2017-09-12 DIAGNOSIS — M1712 Unilateral primary osteoarthritis, left knee: Secondary | ICD-10-CM | POA: Insufficient documentation

## 2017-09-12 DIAGNOSIS — M7989 Other specified soft tissue disorders: Secondary | ICD-10-CM | POA: Diagnosis not present

## 2017-09-12 DIAGNOSIS — I809 Phlebitis and thrombophlebitis of unspecified site: Secondary | ICD-10-CM | POA: Insufficient documentation

## 2017-09-12 DIAGNOSIS — M79609 Pain in unspecified limb: Secondary | ICD-10-CM

## 2017-09-12 DIAGNOSIS — R2243 Localized swelling, mass and lump, lower limb, bilateral: Secondary | ICD-10-CM | POA: Diagnosis not present

## 2017-09-12 DIAGNOSIS — M25562 Pain in left knee: Secondary | ICD-10-CM | POA: Diagnosis not present

## 2017-09-12 HISTORY — DX: Acute embolism and thrombosis of unspecified deep veins of unspecified lower extremity: I82.409

## 2017-09-12 NOTE — ED Notes (Signed)
US at bedside

## 2017-09-12 NOTE — ED Triage Notes (Signed)
C/o knot on medial R lower leg since bumping it on something 1 week ago.  Also c/o knot on L lateral knee since yesterday.  Reports history of DVT.  Denies SOB or leg swelling.

## 2017-09-12 NOTE — ED Notes (Signed)
Vascular at bedside

## 2017-09-12 NOTE — Progress Notes (Addendum)
VASCULAR LAB PRELIMINARY  PRELIMINARY  PRELIMINARY  PRELIMINARY  Bilateral lower extremity venous duplex completed.    Preliminary report:  There is no acute DVT or Baker's cyst noted in the bilateral lower extremities. 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 There is superficial thrombosis noted throughout the right greater saphenous vein from calf to just before the saphenofemoral junction. noted   Gave results to Dr. Ardelle Park, [facility], RVT 09/12/2017, 5:23 PM

## 2017-09-12 NOTE — Discharge Instructions (Addendum)
Take over-the-counter aspirin for the thrombophlebitis, make sure to wear compression stockings, please review the discharge instructions for additional information

## 2017-09-12 NOTE — ED Provider Notes (Signed)
[facility] EMERGENCY DEPARTMENT Provider Note   CSN: [id number] Arrival date & time: 09/12/17  1505     History   Chief Complaint Chief Complaint  Patient presents with  . Leg Pain    HPI Ross [name] is a 67 y.o. male.  HPI Patient has a history of DVT.  He is not currently on any anticoagulants.  Patient started noticed some mild swelling on both of his legs.  He felt a lump in the medial aspect of his right lower leg and also noticed some swelling proximal to his left knee on the lateral aspect.  Patient was concerned that he was developing recurrent DVTs.  He denies any shortness of breath.  He is not having any fevers or chills.  No recent injuries. Past Medical History:  Diagnosis Date  . Diverticulitis   . DVT (deep venous thrombosis) (HCC)   . Hx of brain surgery   . SBO (small bowel obstruction) (HCC) 01/2016    Patient Active Problem List   Diagnosis Date Noted  . SBO (small bowel obstruction) (HCC) 01/12/2016    Past Surgical History:  Procedure Laterality Date  . BRAIN SURGERY    . GASTRECTOMY          Home Medications    Prior to Admission medications   Medication Sig Start Date End Date Taking? Authorizing Provider  amLODipine (NORVASC) 10 MG tablet Take 10 mg by mouth daily. 08/07/17  Yes [provider]  dorzolamide-timolol (COSOPT) 22.3-6.8 MG/ML ophthalmic solution Place 1 drop into the left eye 2 (two) times daily. 08/06/17  Yes [provider]  hydrochlorothiazide (HYDRODIURIL) 25 MG tablet Take 25 mg by mouth daily.     Yes [provider]  latanoprost (XALATAN) 0.005 % ophthalmic solution Place 1 drop into both eyes every evening. 08/06/17  Yes [provider]  metoprolol succinate (TOPROL-XL) 25 MG 24 hr tablet Take 12.5 mg by mouth 2 (two) times daily.    Yes [provider]  Multiple Vitamin (MULTIVITAMIN) capsule Take 1 capsule by mouth daily.     Yes [provider]     Family History No family history on file.  Social History Social History   Tobacco Use  . Smoking status: Never Smoker  . Smokeless tobacco: Never Used  Substance Use Topics  . Alcohol use: No  . Drug use: No     Allergies   Patient has no known allergies.   Review of Systems Review of Systems  All other systems reviewed and are negative.    Physical Exam Updated Vital Signs BP 123/73   Pulse (!) 48   Temp 98.3 F (36.8 C) (Oral)   Resp 20   SpO2 97%   Physical Exam  Constitutional: He appears well-developed and well-nourished. No distress.  HENT:  Head: Normocephalic and atraumatic.  Right Ear: External ear normal.  Left Ear: External ear normal.  Eyes: Conjunctivae are normal. Right eye exhibits no discharge. Left eye exhibits no discharge. No scleral icterus.  Neck: Neck supple. No tracheal deviation present.  Cardiovascular: Normal rate, regular rhythm and intact distal pulses.  Normal pulses in the extremities  Pulmonary/Chest: Effort normal and breath sounds normal. No stridor. No respiratory distress. He has no wheezes. He has no rales.  Abdominal: Soft. Bowel sounds are normal. He exhibits no distension. There is no tenderness. There is no rebound and no guarding.  Musculoskeletal: He exhibits edema. He exhibits no tenderness.  Small nodule palpated medial aspect  of proximal left calf, consistent with varicose vein, edema noted lateral aspect proximal to left knee  Neurological: He is alert. He has normal strength. No cranial nerve deficit (no facial droop, extraocular movements intact, no slurred speech) or sensory deficit. He exhibits normal muscle tone. He displays no seizure activity. Coordination normal.  Normal sensation throughout  Skin: Skin is warm and dry. No rash noted.  Psychiatric: He has a normal mood and affect.  Nursing note and vitals reviewed.    ED Treatments / Results  Labs (all labs ordered are listed, but only abnormal  results are displayed) Labs Reviewed - No data to display  EKG None  Radiology Dg Knee Complete 4 Views Left  Result Date: 09/12/2017 CLINICAL DATA:  Acute LEFT knee pain and swelling for 1 week. No known injury. Initial encounter. EXAM: LEFT KNEE - COMPLETE 4+ VIEW COMPARISON:  05/21/2010 radiographs FINDINGS: No acute fracture, subluxation or dislocation. Mild tricompartmental degenerative changes noted. There may be a very small knee effusion present. Mild soft tissue swelling is present. No focal bony lesions are identified. IMPRESSION: Mild soft tissue swelling without acute bony abnormality. Question very small knee effusion Mild tricompartmental degenerative changes. Electronically Signed   By: Harmon Pier M.D.   On: 09/12/2017 16:36    Procedures Procedures (including critical care time)  Medications Ordered in ED Medications - No data to display   Initial Impression / Assessment and Plan / ED Course  I have reviewed the triage vital signs and the nursing notes.  Pertinent labs & imaging results that were available during my care of the patient were reviewed by me and considered in my medical decision making (see chart for details).  Clinical Course as of Sep 13 1739  Wynelle Link Sep 12, 2017  1732 Vascular study prelim report:  No DVT, superficial thrombophlebitis right leg   [JK]    Clinical Course User Index [JK] Linwood Dibbles, MD    Patient presented to the emergency room with complaints of possible DVT.  Patient's ultrasound does not demonstrate a DVT but does show a superficial thrombophlebitis on the right leg which correlates with his symptoms.  Patient also mentions some swelling of his left knee.  On my exam it was more suggestive of a small knee effusion.  X-ray does show osteoarthritis.  he is not having any significant pain ever is.  No signs to suggest infection.  Findings discussed with patient.  Recommend an aspirin to help with the thrombophlebitis.  Follow-up with his  primary care doctor  Final Clinical Impressions(s) / ED Diagnoses   Final diagnoses:  Thrombophlebitis  Osteoarthritis of left knee, unspecified osteoarthritis type    ED Discharge Orders    None       Linwood Dibbles, MD 09/12/17 1742

## 2017-10-15 DIAGNOSIS — H4062X2 Glaucoma secondary to drugs, left eye, moderate stage: Secondary | ICD-10-CM | POA: Diagnosis not present

## 2017-11-12 DIAGNOSIS — H43811 Vitreous degeneration, right eye: Secondary | ICD-10-CM | POA: Diagnosis not present

## 2019-02-24 DIAGNOSIS — Z79899 Other long term (current) drug therapy: Secondary | ICD-10-CM | POA: Diagnosis not present

## 2019-02-24 DIAGNOSIS — R739 Hyperglycemia, unspecified: Secondary | ICD-10-CM | POA: Diagnosis not present

## 2019-02-24 DIAGNOSIS — E782 Mixed hyperlipidemia: Secondary | ICD-10-CM | POA: Diagnosis not present

## 2019-02-24 DIAGNOSIS — Z125 Encounter for screening for malignant neoplasm of prostate: Secondary | ICD-10-CM | POA: Diagnosis not present

## 2019-02-24 DIAGNOSIS — Z0000 Encounter for general adult medical examination without abnormal findings: Secondary | ICD-10-CM | POA: Diagnosis not present

## 2019-05-23 DIAGNOSIS — Z79899 Other long term (current) drug therapy: Secondary | ICD-10-CM | POA: Diagnosis not present

## 2019-05-23 DIAGNOSIS — I10 Essential (primary) hypertension: Secondary | ICD-10-CM | POA: Diagnosis not present

## 2019-05-23 DIAGNOSIS — H409 Unspecified glaucoma: Secondary | ICD-10-CM | POA: Diagnosis not present

## 2019-07-04 DIAGNOSIS — I10 Essential (primary) hypertension: Secondary | ICD-10-CM | POA: Diagnosis not present

## 2019-07-04 DIAGNOSIS — E669 Obesity, unspecified: Secondary | ICD-10-CM | POA: Diagnosis not present

## 2019-07-04 DIAGNOSIS — Z79899 Other long term (current) drug therapy: Secondary | ICD-10-CM | POA: Diagnosis not present

## 2019-07-04 DIAGNOSIS — H409 Unspecified glaucoma: Secondary | ICD-10-CM | POA: Diagnosis not present

## 2019-11-03 DIAGNOSIS — E669 Obesity, unspecified: Secondary | ICD-10-CM | POA: Diagnosis not present

## 2019-11-03 DIAGNOSIS — I10 Essential (primary) hypertension: Secondary | ICD-10-CM | POA: Diagnosis not present

## 2019-11-03 DIAGNOSIS — Z79899 Other long term (current) drug therapy: Secondary | ICD-10-CM | POA: Diagnosis not present

## 2019-11-03 DIAGNOSIS — H409 Unspecified glaucoma: Secondary | ICD-10-CM | POA: Diagnosis not present

## 2020-05-03 DIAGNOSIS — Z23 Encounter for immunization: Secondary | ICD-10-CM | POA: Diagnosis not present

## 2020-05-03 DIAGNOSIS — I10 Essential (primary) hypertension: Secondary | ICD-10-CM | POA: Diagnosis not present

## 2020-05-03 DIAGNOSIS — Z0000 Encounter for general adult medical examination without abnormal findings: Secondary | ICD-10-CM | POA: Diagnosis not present

## 2020-05-03 DIAGNOSIS — E782 Mixed hyperlipidemia: Secondary | ICD-10-CM | POA: Diagnosis not present

## 2020-05-03 DIAGNOSIS — Z125 Encounter for screening for malignant neoplasm of prostate: Secondary | ICD-10-CM | POA: Diagnosis not present

## 2020-05-03 DIAGNOSIS — R739 Hyperglycemia, unspecified: Secondary | ICD-10-CM | POA: Diagnosis not present

## 2020-08-19 DIAGNOSIS — Z01818 Encounter for other preprocedural examination: Secondary | ICD-10-CM | POA: Diagnosis not present

## 2020-09-20 DIAGNOSIS — Z86010 Personal history of colonic polyps: Secondary | ICD-10-CM | POA: Diagnosis not present

## 2020-09-20 DIAGNOSIS — Z980 Intestinal bypass and anastomosis status: Secondary | ICD-10-CM | POA: Diagnosis not present

## 2020-09-20 DIAGNOSIS — K640 First degree hemorrhoids: Secondary | ICD-10-CM | POA: Diagnosis not present

## 2020-09-20 DIAGNOSIS — K5730 Diverticulosis of large intestine without perforation or abscess without bleeding: Secondary | ICD-10-CM | POA: Diagnosis not present

## 2020-10-09 DIAGNOSIS — T8189XA Other complications of procedures, not elsewhere classified, initial encounter: Secondary | ICD-10-CM | POA: Diagnosis not present

## 2021-02-11 DIAGNOSIS — S76112A Strain of left quadriceps muscle, fascia and tendon, initial encounter: Secondary | ICD-10-CM | POA: Diagnosis not present

## 2021-02-11 DIAGNOSIS — M25562 Pain in left knee: Secondary | ICD-10-CM | POA: Diagnosis not present

## 2021-02-13 DIAGNOSIS — S76112A Strain of left quadriceps muscle, fascia and tendon, initial encounter: Secondary | ICD-10-CM | POA: Diagnosis not present

## 2021-03-13 DIAGNOSIS — S76112D Strain of left quadriceps muscle, fascia and tendon, subsequent encounter: Secondary | ICD-10-CM | POA: Diagnosis not present

## 2021-04-24 DIAGNOSIS — S76112D Strain of left quadriceps muscle, fascia and tendon, subsequent encounter: Secondary | ICD-10-CM | POA: Diagnosis not present

## 2022-02-09 DIAGNOSIS — R7303 Prediabetes: Secondary | ICD-10-CM | POA: Diagnosis not present

## 2022-02-09 DIAGNOSIS — Z23 Encounter for immunization: Secondary | ICD-10-CM | POA: Diagnosis not present

## 2022-02-09 DIAGNOSIS — I10 Essential (primary) hypertension: Secondary | ICD-10-CM | POA: Diagnosis not present

## 2022-02-09 DIAGNOSIS — H409 Unspecified glaucoma: Secondary | ICD-10-CM | POA: Diagnosis not present

## 2022-02-09 DIAGNOSIS — Z136 Encounter for screening for cardiovascular disorders: Secondary | ICD-10-CM | POA: Diagnosis not present

## 2022-02-09 DIAGNOSIS — Z0000 Encounter for general adult medical examination without abnormal findings: Secondary | ICD-10-CM | POA: Diagnosis not present

## 2022-08-28 DIAGNOSIS — H2513 Age-related nuclear cataract, bilateral: Secondary | ICD-10-CM | POA: Diagnosis not present

## 2023-03-31 DIAGNOSIS — Z136 Encounter for screening for cardiovascular disorders: Secondary | ICD-10-CM | POA: Diagnosis not present

## 2023-03-31 DIAGNOSIS — R7303 Prediabetes: Secondary | ICD-10-CM | POA: Diagnosis not present

## 2023-03-31 DIAGNOSIS — Z0000 Encounter for general adult medical examination without abnormal findings: Secondary | ICD-10-CM | POA: Diagnosis not present

## 2023-03-31 DIAGNOSIS — Z13220 Encounter for screening for lipoid disorders: Secondary | ICD-10-CM | POA: Diagnosis not present

## 2023-03-31 DIAGNOSIS — I10 Essential (primary) hypertension: Secondary | ICD-10-CM | POA: Diagnosis not present

## 2023-10-25 DIAGNOSIS — H401134 Primary open-angle glaucoma, bilateral, indeterminate stage: Secondary | ICD-10-CM | POA: Diagnosis not present
